# Patient Record
Sex: Female | Born: 1985 | Race: White | Hispanic: No | Smoking: Current every day smoker
Health system: Southern US, Community
[De-identification: ages and names within clinical notes are randomized; demographics above are authoritative.]

## PROBLEM LIST (undated history)

## (undated) DIAGNOSIS — N809 Endometriosis, unspecified: Secondary | ICD-10-CM

## (undated) DIAGNOSIS — K589 Irritable bowel syndrome without diarrhea: Secondary | ICD-10-CM

## (undated) DIAGNOSIS — Z8742 Personal history of other diseases of the female genital tract: Secondary | ICD-10-CM

## (undated) HISTORY — DX: Irritable bowel syndrome without diarrhea: K58.9

## (undated) HISTORY — PX: OTHER SURGICAL HISTORY: SHX169

## (undated) HISTORY — DX: Personal history of other diseases of the female genital tract: Z87.42

## (undated) HISTORY — DX: Endometriosis, unspecified: N80.9

## (undated) HISTORY — PX: TUBAL LIGATION: SHX77

---

## 2009-07-18 ENCOUNTER — Ambulatory Visit: Payer: Self-pay | Admitting: Internal Medicine

## 2009-10-28 ENCOUNTER — Ambulatory Visit: Payer: Self-pay

## 2009-10-29 ENCOUNTER — Observation Stay: Payer: Self-pay | Admitting: Obstetrics & Gynecology

## 2010-03-17 ENCOUNTER — Observation Stay: Payer: Self-pay

## 2010-06-12 ENCOUNTER — Inpatient Hospital Stay: Payer: Self-pay | Admitting: Obstetrics and Gynecology

## 2010-06-12 DIAGNOSIS — Q759 Congenital malformation of skull and face bones, unspecified: Secondary | ICD-10-CM

## 2010-06-17 LAB — PATHOLOGY REPORT

## 2011-04-19 ENCOUNTER — Ambulatory Visit: Payer: Self-pay | Admitting: Internal Medicine

## 2011-09-12 ENCOUNTER — Emergency Department: Payer: Self-pay | Admitting: Emergency Medicine

## 2011-09-12 LAB — CBC WITH DIFFERENTIAL/PLATELET
Basophil #: 0 10*3/uL (ref 0.0–0.1)
Basophil %: 0.3 %
Eosinophil #: 0.1 10*3/uL (ref 0.0–0.7)
Eosinophil %: 2.6 %
HCT: 37.5 % (ref 35.0–47.0)
HGB: 12.6 g/dL (ref 12.0–16.0)
Lymphocyte %: 33.7 %
MCH: 30.3 pg (ref 26.0–34.0)
Monocyte #: 0.4 10*3/uL (ref 0.0–0.7)
Neutrophil #: 2.6 10*3/uL (ref 1.4–6.5)
Neutrophil %: 54.9 %
Platelet: 133 10*3/uL — ABNORMAL LOW (ref 150–440)
RBC: 4.15 10*6/uL (ref 3.80–5.20)
WBC: 4.8 10*3/uL (ref 3.6–11.0)

## 2011-09-12 LAB — COMPREHENSIVE METABOLIC PANEL
Albumin: 3.8 g/dL (ref 3.4–5.0)
BUN: 18 mg/dL (ref 7–18)
Bilirubin,Total: 0.2 mg/dL (ref 0.2–1.0)
Chloride: 105 mmol/L (ref 98–107)
Co2: 29 mmol/L (ref 21–32)
EGFR (Non-African Amer.): >60
Glucose: 89 mg/dL (ref 65–99)
Potassium: 4.2 mmol/L (ref 3.5–5.1)
SGOT(AST): 25 U/L (ref 15–37)
SGPT (ALT): 20 U/L

## 2011-09-12 LAB — LIPASE, BLOOD: Lipase: 111 U/L (ref 73–393)

## 2011-09-12 LAB — URINALYSIS, COMPLETE
Blood: NEGATIVE
Glucose,UR: NEGATIVE mg/dL (ref 0–75)
Nitrite: NEGATIVE
Ph: 8 (ref 4.5–8.0)
Protein: 100
RBC,UR: 2 /HPF (ref 0–5)
Specific Gravity: 1.026 (ref 1.003–1.030)
Squamous Epithelial: 3

## 2011-09-12 LAB — PREGNANCY, URINE: Pregnancy Test, Urine: NEGATIVE m[IU]/mL

## 2011-09-12 LAB — TROPONIN I: Troponin-I: <0.02 ng/mL

## 2011-12-17 ENCOUNTER — Other Ambulatory Visit (HOSPITAL_COMMUNITY)
Admission: RE | Admit: 2011-12-17 | Discharge: 2011-12-17 | Disposition: A | Discharge status: Home / Self Care | Payer: Medicaid Other | Source: Ambulatory Visit | Attending: Obstetrics & Gynecology | Admitting: Obstetrics & Gynecology

## 2011-12-17 DIAGNOSIS — Z01419 Encounter for gynecological examination (general) (routine) without abnormal findings: Secondary | ICD-10-CM | POA: Insufficient documentation

## 2012-01-28 ENCOUNTER — Emergency Department (HOSPITAL_COMMUNITY): Payer: Medicaid Other

## 2012-01-28 ENCOUNTER — Emergency Department (HOSPITAL_COMMUNITY)
Admission: EM | Admit: 2012-01-28 | Discharge: 2012-01-28 | Disposition: A | Discharge status: Home / Self Care | Payer: Medicaid Other | Attending: Emergency Medicine | Admitting: Emergency Medicine

## 2012-01-28 ENCOUNTER — Encounter (HOSPITAL_COMMUNITY): Payer: Self-pay | Admitting: Emergency Medicine

## 2012-01-28 DIAGNOSIS — M549 Dorsalgia, unspecified: Secondary | ICD-10-CM | POA: Insufficient documentation

## 2012-01-28 DIAGNOSIS — R079 Chest pain, unspecified: Secondary | ICD-10-CM

## 2012-01-28 DIAGNOSIS — Z882 Allergy status to sulfonamides status: Secondary | ICD-10-CM | POA: Insufficient documentation

## 2012-01-28 MED ORDER — OXYCODONE-ACETAMINOPHEN 5-325 MG PO TABS: 1.0000 | ORAL_TABLET | ORAL | Status: DC | PRN

## 2012-01-28 MED ORDER — IBUPROFEN 800 MG PO TABS
800.0000 mg | ORAL_TABLET | Freq: Once | ORAL | Status: AC
  Administered 2012-01-28: 800 mg via ORAL
  Filled 2012-01-28: qty 1

## 2012-01-28 NOTE — ED Notes (Signed)
Pt c/o chest pain when she swallows something solid. Pt states she can drink liquids fine. Pt has seen her pcp for the same, she states this started yesterday.

## 2012-01-28 NOTE — ED Provider Notes (Signed)
History    26 year old female with chest pain. Onset about 2 days ago. Chest pain is in lower sternal area. Constant. Worse with certain movements such as sitting up and bending over. No radiation. Today she had increasing pain when she swallowed. No fever or chills. No vomiting. No SOB. No unsual leg pain or swelling. No prior hx of same.  CSN: [id number]  Arrival date & time 01/28/12  1626   First MD Initiated Contact with Patient 01/28/12 1733      Chief Complaint  Patient presents with  . Chest Pain  . Back Pain    (Consider location/radiation/quality/duration/timing/severity/associated sxs/prior treatment) HPI  History reviewed. No pertinent past medical history.  History reviewed. No pertinent past surgical history.  History reviewed. No pertinent family history.  History  Substance Use Topics  . Smoking status: Not on file  . Smokeless tobacco: Not on file  . Alcohol Use: No    OB History    Grav Para Term Preterm Abortions TAB SAB Ect Mult Living                  Review of Systems   Review of symptoms negative unless otherwise noted in HPI.   Allergies  Sulfa antibiotics  Home Medications   Current Outpatient Rx  Name Route Sig Dispense Refill  . B-12 PO Oral Take 1 tablet by mouth daily.    . IBUPROFEN 200 MG PO TABS Oral Take 800 mg by mouth 2 (two) times daily as needed. For pain    . MIRTAZAPINE 15 MG PO TABS Oral Take 7.5 mg by mouth at bedtime. *May increase to one tablet if tolerated*    . OMEPRAZOLE 20 MG PO CPDR Oral Take 20 mg by mouth daily.    . SUCRALFATE 1 GM/10ML PO SUSP Oral Take 1 g by mouth 4 (four) times daily.    Marland Kitchen CEFDINIR 300 MG PO CAPS Oral Take 300 mg by mouth 2 (two) times daily.      BP 107/63  Pulse 75  Temp 98.3 F (36.8 C) (Oral)  Resp 18  Ht 5\' 5"  (1.651 m)  Wt 117 lb (53.071 kg)  BMI 19.47 kg/m2  SpO2 99%  LMP 12/28/2011  Physical Exam  Nursing note and vitals reviewed. Constitutional: She appears  well-developed and well-nourished. No distress.  HENT:  Head: Normocephalic and atraumatic.  Eyes: Conjunctivae are normal. Right eye exhibits no discharge. Left eye exhibits no discharge.  Neck: Neck supple.  Cardiovascular: Normal rate, regular rhythm and normal heart sounds.  Exam reveals no gallop and no friction rub.   No murmur heard. Pulmonary/Chest: Effort normal and breath sounds normal. No respiratory distress. She exhibits no tenderness.  Abdominal: Soft. She exhibits no distension. There is no tenderness.  Musculoskeletal: She exhibits no edema and no tenderness.       Lower extremities symmetric as compared to each other. No calf tenderness. Negative Homan's. No palpable cords.   Neurological: She is alert.  Skin: Skin is warm and dry.  Psychiatric: She has a normal mood and affect. Her behavior is normal. Thought content normal.    ED Course  Procedures (including critical care time)  Labs Reviewed - No data to display Dg Chest 2 View  01/28/2012  *RADIOLOGY REPORT*  Clinical Data: Chest pain.  CHEST - 2 VIEW  Comparison: None.  Findings: Heart and mediastinal contours are within normal limits. No focal opacities or effusions.  No acute bony abnormality.  IMPRESSION: No active cardiopulmonary  disease.  Original Report Authenticated By: Cyndie Chime, M.D.   EKG:  Rhythm: normal sinus Rate: 96 Axis: normal Intervals: normal ST segments: normal   1. Chest pain       MDM  26yf with CP. Suspect musculoskeletal given reproducibility of symptoms with movement although doesn't explain acute exacerbation with swallowing today. Possible spasm or other esophageal etiology. No preceding vomiting to suggest rupture. Very atypical for ACS. Consider myo/pericarditis but symptoms exacerbated by twisting sepcifically. No rub auccultated. No viral prodrome. EKG reassuring. Consider PE but doubt. No dyspnea, not hypoxic, no signs/symptoms of DVT and no significant risk factors.  Doubt infectious. CXR clear. Afebrile. Etiology not completely clear but low suspicion for emergent etiology. Will tx symptomatically at this time. Return precautions discussed. Outp fu.        Raeford Razor, MD 02/04/12 [phone]

## 2012-01-28 NOTE — ED Notes (Signed)
Patient tearful when RN entered room.  States she is still having pain and MD told her he cannot find anything wrong with her.  Advised will check on something for pain.

## 2012-01-28 NOTE — ED Notes (Signed)
Pt states that the pain is in her mid chest and increases with any movement, she states that it hurts to move, belch, and eat solid foods.  Pt states that there is a heaviness when taking a deep breath.  Pt denies any sob.  Pt denies any strenuous activity recently.  Pt describes pain as "constant" and "sharp with movement".

## 2012-02-07 ENCOUNTER — Encounter: Payer: Self-pay | Admitting: Gastroenterology

## 2012-02-07 ENCOUNTER — Ambulatory Visit (INDEPENDENT_AMBULATORY_CARE_PROVIDER_SITE_OTHER): Payer: Medicaid Other | Admitting: Gastroenterology

## 2012-02-07 ENCOUNTER — Encounter (HOSPITAL_COMMUNITY): Payer: Self-pay | Admitting: Pharmacy Technician

## 2012-02-07 VITALS — BP 97/60 | HR 72 | Temp 97.9°F | Ht 65.0 in | Wt 116.4 lb

## 2012-02-07 DIAGNOSIS — R1310 Dysphagia, unspecified: Secondary | ICD-10-CM

## 2012-02-07 DIAGNOSIS — R079 Chest pain, unspecified: Secondary | ICD-10-CM

## 2012-02-07 MED ORDER — SODIUM CHLORIDE 0.45 % IV SOLN: INTRAVENOUS | Status: DC

## 2012-02-07 MED ORDER — OMEPRAZOLE 20 MG PO CPDR: 20.0000 mg | DELAYED_RELEASE_CAPSULE | Freq: Two times a day (BID) | ORAL | Status: DC

## 2012-02-07 NOTE — Progress Notes (Signed)
Faxed to PCP

## 2012-02-07 NOTE — Progress Notes (Signed)
Referring Provider: Jeni Salles, NP Primary Care Physician:  Jay Schlichter, OT Primary Gastroenterologist:  Dr. Darrick Penna   Chief Complaint  Patient presents with  . Pain    center of chest    HPI:   Ms. Briana Norton is a very pleasant 26 year old female who presents at the request of Ninfa Linden, NP due to chest discomfort and abdominal pain. Notes first episode of pain in March 2013, located epigastric region. Was told she may have an ulcer vs gallbladder issues. Treated X 2 weeks with PPI, not sure name. No improvement. Now notes new onset left-sided chest discomfort. Feels like somebody jabbed something through her. Worse with movement. Worse than having kids. Woke up Thursday, felt had been punched in chest. Fine during day. Rolling side to side during sleep woke her up. Eating caused excruciating pain in epigastric region.  Sunday, went to [facility] per PCP. Blood work done, pt states labs showed possible elevation to indicate ?PE; however, CT negative per pt. Sent home. GI cocktail helped. Now "bothersome in esophagus". +N/V with pain medication. No reflux. No melena. Ibuprofen a lot. Usually takes 800 mg QOD. Notes prior to these episodes was taking Ibuprofen daily.  Has lost 15 lbs since March. Going through separation. "nasty" divorce. 3 kids. States average wt was 130. Notes painful swallowing.   Past Medical History  Diagnosis Date  . Hx of menorrhagia     ? endometriosis    Past Surgical History  Procedure Date  . Tubal ligation     Current Outpatient Prescriptions  Medication Sig Dispense Refill  . Cyanocobalamin (B-12 PO) Take 1 tablet by mouth daily.      Marland Kitchen ibuprofen (ADVIL,MOTRIN) 200 MG tablet Take 800 mg by mouth 2 (two) times daily as needed. For pain      . mirtazapine (REMERON) 15 MG tablet Take 7.5 mg by mouth at bedtime. *May increase to one tablet if tolerated*      . sucralfate (CARAFATE) 1 GM/10ML suspension Take 1 g by mouth 4 (four) times daily.      Marland Kitchen  omeprazole (PRILOSEC) 20 MG capsule Take 20 mg by mouth 2 (two) times daily.       No current facility-administered medications for this visit.   Facility-Administered Medications Ordered in Other Visits  Medication Dose Route Frequency Provider Last Rate Last Dose  . 0.45 % sodium chloride infusion   Intravenous Continuous [place], MD        Allergies as of 02/07/2012 - Review Complete 02/07/2012  Allergen Reaction Noted  . Sulfa antibiotics Rash 01/28/2012    Family History  Problem Relation Age of Onset  . Colon cancer Maternal Grandfather     History   Social History  . Marital Status: Legally Separated    Spouse Name: N/A    Number of Children: N/A  . Years of Education: N/A   Occupational History  . 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 Smoking status: Current Everyday Smoker -- 0.5 packs/day    Types: Cigarettes  . Smokeless tobacco: Not on file  . Alcohol Use: No  . Drug Use: No  . Sexually Active: Not on file   Other Topics Concern  . Not on file   Social History Narrative  . No narrative on file    Review of Systems: Gen: Denies any fever, chills, loss of appetite, fatigue, weight loss. CV: Denies chest pain, heart palpitations, syncope,  peripheral edema. Resp: Denies shortness of breath with rest, cough, wheezing GI: SEE HPI GU : Denies urinary burning, urinary frequency, urinary incontinence.  MS: Denies joint pain, muscle weakness, cramps, limited movement Derm: Denies rash, itching, dry skin Psych: Denies depression, anxiety, confusion or memory loss  Heme: Denies bruising, bleeding, and enlarged lymph nodes.  Physical Exam: BP 97/60  Pulse 72  Temp 97.9 F (36.6 C) (Temporal)  Ht 5\' 5"  (1.651 m)  Wt 116 lb 6.4 oz (52.799 kg)  BMI 19.37 kg/m2  LMP 01/28/2012 General:   Alert and oriented. Well-developed, well-nourished, pleasant and cooperative. Head:  Normocephalic and  atraumatic. Eyes:  Conjunctiva pink, sclera clear, no icterus.   Conjunctiva pink. Ears:  Normal auditory acuity. Nose:  No deformity, discharge,  or lesions. Mouth:  No deformity or lesions, mucosa pink and moist.  Neck:  Supple, without mass or thyromegaly. Lungs:  Clear to auscultation bilaterally, without wheezing, rales, or rhonchi.  Heart:  S1, S2 present without murmurs noted.  Abdomen:  +BS, soft, TTP epigastric, LUQ and non-distended. Without mass or HSM. No rebound or guarding. No hernias noted. Rectal:  Deferred  Msk:  Symmetrical without gross deformities. Normal posture. Extremities:  Without clubbing or edema. Neurologic:  Alert and  oriented x4;  grossly normal neurologically. Skin:  Intact, warm and dry without significant lesions or rashes Cervical Nodes:  No significant cervical adenopathy. Psych:  Alert and cooperative. Normal mood and affect.

## 2012-02-07 NOTE — Patient Instructions (Addendum)
We have set you up for an upper endoscopy with Dr. Darrick Penna.   Start taking Prilosec twice a day, 30 minutes before breakfast and dinner. Samples have been provided, and I have sent a prescription to your pharmacy.   Avoid Ibuprofen.   Further recommendations once the procedure is complete.

## 2012-02-07 NOTE — Assessment & Plan Note (Signed)
Left-sided chest discomfort, epigastric pain with eating in the setting of daily to every other day Ibuprofen use. Notes odynophagia as well. Prior episode of epigastric pain in March 2013. Recent abx for sinus infection. Query esophagitis, gastritis, PUD. (PE ruled out at [facility] recently). Highly doubt cardiac/pulmonary etiology. Increase PPI to BID, proceed with EGD, possible dilation in near future.   Proceed with upper endoscopy in the near future with Dr. Darrick Penna. The risks, benefits, and alternatives have been discussed in detail with patient. They have stated understanding and desire to proceed.

## 2012-02-07 NOTE — Assessment & Plan Note (Signed)
?  candida esophagitis? Pill-induced?

## 2012-02-08 ENCOUNTER — Ambulatory Visit (HOSPITAL_COMMUNITY)
Admission: RE | Admit: 2012-02-08 | Discharge: 2012-02-08 | Disposition: A | Discharge status: Home / Self Care | Payer: Medicaid Other | Source: Ambulatory Visit | Attending: Gastroenterology | Admitting: Gastroenterology

## 2012-02-08 ENCOUNTER — Encounter (HOSPITAL_COMMUNITY): Payer: Self-pay

## 2012-02-08 ENCOUNTER — Encounter (HOSPITAL_COMMUNITY)
Admission: RE | Disposition: A | Discharge status: Home / Self Care | Payer: Self-pay | Source: Ambulatory Visit | Attending: Gastroenterology

## 2012-02-08 DIAGNOSIS — R1013 Epigastric pain: Secondary | ICD-10-CM

## 2012-02-08 DIAGNOSIS — K2940 Chronic atrophic gastritis without bleeding: Secondary | ICD-10-CM | POA: Insufficient documentation

## 2012-02-08 DIAGNOSIS — K2970 Gastritis, unspecified, without bleeding: Secondary | ICD-10-CM

## 2012-02-08 DIAGNOSIS — R634 Abnormal weight loss: Secondary | ICD-10-CM

## 2012-02-08 DIAGNOSIS — K2990 Gastroduodenitis, unspecified, without bleeding: Secondary | ICD-10-CM

## 2012-02-08 DIAGNOSIS — R109 Unspecified abdominal pain: Secondary | ICD-10-CM | POA: Insufficient documentation

## 2012-02-08 DIAGNOSIS — R079 Chest pain, unspecified: Secondary | ICD-10-CM

## 2012-02-08 HISTORY — PX: ESOPHAGOGASTRODUODENOSCOPY: SHX1529

## 2012-02-08 SURGERY — ESOPHAGOGASTRODUODENOSCOPY (EGD) WITH ESOPHAGEAL DILATION
Anesthesia: Moderate Sedation

## 2012-02-08 MED ORDER — BUTAMBEN-TETRACAINE-BENZOCAINE 2-2-14 % EX AERO
INHALATION_SPRAY | CUTANEOUS | Status: DC | PRN
  Administered 2012-02-08: 1 via TOPICAL

## 2012-02-08 MED ORDER — STERILE WATER FOR IRRIGATION IR SOLN
Status: DC | PRN
  Administered 2012-02-08: 14:00:00

## 2012-02-08 MED ORDER — MIDAZOLAM HCL 5 MG/5ML IJ SOLN
INTRAMUSCULAR | Status: AC
  Filled 2012-02-08: qty 10

## 2012-02-08 MED ORDER — MEPERIDINE HCL 100 MG/ML IJ SOLN
INTRAMUSCULAR | Status: AC
  Filled 2012-02-08: qty 2

## 2012-02-08 MED ORDER — PROMETHAZINE HCL 25 MG/ML IJ SOLN
INTRAMUSCULAR | Status: AC
  Filled 2012-02-08: qty 1

## 2012-02-08 MED ORDER — MINERAL OIL PO OIL
TOPICAL_OIL | ORAL | Status: AC
  Filled 2012-02-08: qty 30

## 2012-02-08 MED ORDER — MEPERIDINE HCL 100 MG/ML IJ SOLN
INTRAMUSCULAR | Status: DC | PRN
  Administered 2012-02-08: 50 mg via INTRAVENOUS
  Administered 2012-02-08: 25 mg via INTRAVENOUS

## 2012-02-08 MED ORDER — MIDAZOLAM HCL 5 MG/5ML IJ SOLN
INTRAMUSCULAR | Status: DC | PRN
  Administered 2012-02-08 (×2): 2 mg via INTRAVENOUS

## 2012-02-08 MED ORDER — PROMETHAZINE HCL 25 MG/ML IJ SOLN
INTRAMUSCULAR | Status: DC | PRN
  Administered 2012-02-08: 12.5 mg via INTRAVENOUS

## 2012-02-08 NOTE — Op Note (Signed)
[facility] [address] [place], [address]   ENDOSCOPY PROCEDURE REPORT  PATIENT: Briana Norton, Briana Norton  MR#: [id number] BIRTHDATE: 1986-02-12 , 26  yrs. old GENDER: Female  ENDOSCOPIST: Jonette Eva, MD REFFERED VF:IEPPI Patterson, NP  PROCEDURE DATE:  02/08/2012  PROCEDURE:   EGD with biopsy and EGD with dilatation over guidewire   INDICATIONS:1.  abdominal pain.   2.  weight loss: 30-40 LBS.  NO CHANGE IN BOWEL HABITS. CT OF CHEST AT [facility] SHOWED NO ACUTE PROCESS  MEDICATIONS: Demerol 75, Versed 4, and Promethazine (Phenergan) 12.5  TOPICAL ANESTHETIC: Cetacaine Spray  DESCRIPTION OF PROCEDURE:   After the risks benefits and alternatives of the procedure were thoroughly explained, informed consent was obtained.  The EG-[id number] ([id number])  endoscope was introduced through the mouth and advanced to the second portion of the duodenum.  The instrument was slowly withdrawn as the mucosa was carefully examined.  Prior to withdrawal of the scope, the guidwire was placed.  The esophagus was dilated successfully.  The patient was recovered in endoscopy and discharged home in satisfactory condition.      ESOPHAGUS: The mucosa of the esophagus appeared normal.  Multiple biopsies were performed 20 M AND 33 CM FROM THE TEETH. GE JXN 38 CM FROM THE TEETH.   NO BARRETT'S.  STOMACH: Mild non-erosive gastritis (inflammation) was found in the gastric antrum.  BIOPSIED VIA COLD FORCEPS.  DUODENUM: The duodenal mucosa showed no abnormalities.  Cold forcep biopsies were taken in the second portion.   Dilation was then performed at the gastroesphageal junction  Dilator: Savary over guidewire Size(s): 16 MM Resistance: moderate Heme: none  COMPLICATIONS: There were no complications.   ENDOSCOPIC IMPRESSION: 1.   The mucosa of the esophagus appeared normal; multiple biopsies 2.   Non-erosive gastritis (inflammation) was found in the gastric antrum 3.   The duodenal mucosa  showed no abnormalities 4.  NO OBVIOUS SOURCE FOR CHEST/EPIGASTRIC PAIN & WEIGHT LOSS IDENTIFIED.  RECOMMENDATIONS: PPI bid BOOST, ENSURE, OR CIB QID AWAIT BIOPSY OPV IN 1 MO. IF SX PERSISTS, CONSIDER BAS[facility] AND/OR REFERRAL TO [facility]       _______________________________ eSignedJonette Eva, MD 02/08/2012 2:49 PM      PATIENT NAME:  Briana Norton, Briana Norton MR#: [id number]

## 2012-02-08 NOTE — H&P (Signed)
  Primary Care Physician:  Jay Schlichter, OT Primary Gastroenterologist:  Dr. Darrick Penna  Pre-Procedure History & Physical: HPI:  Briana Norton is a 26 y.o. female here for ABDOMINAL PAIN/WEIGHT LOSS.  Past Medical History  Diagnosis Date  . Hx of menorrhagia     ? endometriosis    Past Surgical History  Procedure Date  . Tubal ligation     Prior to Admission medications   Medication Sig Start Date End Date Taking? Authorizing Provider  Cyanocobalamin (B-12 PO) Take 1 tablet by mouth daily.   Yes Historical Provider, MD  ibuprofen (ADVIL,MOTRIN) 200 MG tablet Take 800 mg by mouth 2 (two) times daily as needed. For pain   Yes Historical Provider, MD  mirtazapine (REMERON) 15 MG tablet Take 7.5 mg by mouth at bedtime. *May increase to one tablet if tolerated*   Yes Historical Provider, MD  omeprazole (PRILOSEC) 20 MG capsule Take 20 mg by mouth 2 (two) times daily.   Yes Historical Provider, MD  sucralfate (CARAFATE) 1 GM/10ML suspension Take 1 g by mouth 4 (four) times daily. 01/28/12  Yes Historical Provider, MD    Allergies as of 02/07/2012 - Review Complete 02/07/2012  Allergen Reaction Noted  . Sulfa antibiotics Rash 01/28/2012    Family History  Problem Relation Age of Onset  . Colon cancer Maternal Grandfather     History   Social History  . Marital Status: Legally Separated    Spouse Name: N/A    Number of Children: N/A  . Years of Education: N/A   Occupational History  . 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 Smoking status: Current Everyday Smoker -- 0.5 packs/day    Types: Cigarettes  . Smokeless tobacco: Not on file  . Alcohol Use: No  . Drug Use: No  . Sexually Active: Not on file   Other Topics Concern  . Not on file   Social History Narrative  . No narrative on file    Review of Systems: See HPI, otherwise negative ROS   Physical Exam: LMP 01/28/2012 General:   Alert,  pleasant and  cooperative in NAD Head:  Normocephalic and atraumatic. Neck:  Supple;  Lungs:  Clear throughout to auscultation.    Heart:  Regular rate and rhythm. Abdomen:  Soft, nontender and nondistended. Normal bowel sounds, without guarding, and without rebound.   Neurologic:  Alert and  oriented x4;  grossly normal neurologically.  Impression/Plan:    ABDOMINAL PAIN/WEIGHT LOSS.  Plan: 1. EGD TODAY

## 2012-02-08 NOTE — Progress Notes (Signed)
REVIEWED.  

## 2012-02-17 ENCOUNTER — Telehealth: Payer: Self-pay | Admitting: Gastroenterology

## 2012-02-17 NOTE — Telephone Encounter (Signed)
appt set for Sept 30 w/ AS

## 2012-02-17 NOTE — Progress Notes (Signed)
Received ED notes from [facility]. D-dimer 668. CTA ordered, do not have actual report but do have note from resident stating "CTA showed no PE or aortic dissection, pt felt safe for d/c home with viscous lidocaine, norco, and Toradol for pain control and GI referral".

## 2012-02-17 NOTE — Telephone Encounter (Signed)
Please call pt. HER stomach Bx shows gastritis. THE BIOPSIES OF HER ESOPHAGUS AND SMALL BOWEL ARE NORMAL.   CONTINUE OMEPRAZOLE 30 MINUTES PRIOR TO MEALS TWICE DAILY.  CONTINUE REMERON.  BOOST, ENSURE, OR CARNATION INSTANT BREAKFAST FOUR TIMES A DAY.  FOLLOW UP IN LATE SEP/EARLY OCT WITH AS E 30-ABD PAIN.

## 2012-02-17 NOTE — Telephone Encounter (Signed)
Pt aware of results. Darl Pikes can you NIC for Sept or Oct. (E-30)

## 2012-03-10 ENCOUNTER — Encounter: Payer: Self-pay | Admitting: Gastroenterology

## 2012-03-13 ENCOUNTER — Ambulatory Visit (INDEPENDENT_AMBULATORY_CARE_PROVIDER_SITE_OTHER): Payer: Medicaid Other | Admitting: Gastroenterology

## 2012-03-13 ENCOUNTER — Encounter: Payer: Self-pay | Admitting: Gastroenterology

## 2012-03-13 VITALS — BP 104/66 | HR 66 | Temp 98.2°F | Ht 65.0 in | Wt 118.0 lb

## 2012-03-13 DIAGNOSIS — R1310 Dysphagia, unspecified: Secondary | ICD-10-CM

## 2012-03-13 DIAGNOSIS — R079 Chest pain, unspecified: Secondary | ICD-10-CM

## 2012-03-13 NOTE — Patient Instructions (Addendum)
Continue taking Prilosec twice a day.   Dr. Lodema Hong and Dr. Jeanice Lim are both primary care providers in the same office. Their number is [phone].]  Return in 6 months; however, if you are doing well, you can return in 1 year.

## 2012-03-13 NOTE — Progress Notes (Signed)
Faxed to PCP

## 2012-03-13 NOTE — Progress Notes (Signed)
Referring Provider: Jeni Salles, NP Primary Care Physician:  Ninfa Linden, NP Primary Gastroenterologist: Dr. Darrick Penna   Chief Complaint  Patient presents with  . Follow-up    HPI:   Recently underwent EGD/ED with Dr. Darrick Penna secondary to chest pain, odynophagia. Findings of non-erosive gastritis, negative H.pylori, s/p Savary dilation. PPI BID. Now up 2 lbs. Recommendations to complete barium swallow and/or refer to [facility] if continued symptoms.  Pt returns today with significant improvement since dilation. Intermittent reflux but as long as takes Prilosec BID she does well. Not doing Boost/Ensure in a few weeks. Appetite has come back. Child support taken care of. Jan 4th will be one year separated, can file for divorce.   Past Medical History  Diagnosis Date  . Hx of menorrhagia     ? endometriosis    Past Surgical History  Procedure Date  . Tubal ligation   . Esophagogastroduodenoscopy 02/08/2012    Non-erosive gastritis (inflammation) was found in the gastric antrum/ The mucosa of the esophagus appeared normal; multiple biopsies, path: negative h.pylori    Current Outpatient Prescriptions  Medication Sig Dispense Refill  . Cyanocobalamin (B-12 PO) Take 1 tablet by mouth daily.      Marland Kitchen ibuprofen (ADVIL,MOTRIN) 200 MG tablet Take 800 mg by mouth 2 (two) times daily as needed. For pain      . mirtazapine (REMERON) 15 MG tablet Take 7.5 mg by mouth at bedtime. *May increase to one tablet if tolerated*      . omeprazole (PRILOSEC) 20 MG capsule Take 20 mg by mouth 2 (two) times daily.        Allergies as of 03/13/2012 - Review Complete 03/13/2012  Allergen Reaction Noted  . Sulfa antibiotics Rash 01/28/2012    Family History  Problem Relation Age of Onset  . Colon cancer Maternal Grandfather     History   Social History  . Marital Status: Legally Separated    Spouse Name: N/A    Number of Children: N/A  . Years of Education: N/A   Occupational History  .  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 Smoking status: Current Every Day Smoker -- 0.5 packs/day    Types: Cigarettes  . Smokeless tobacco: None  . Alcohol Use: No  . Drug Use: No  . Sexually Active: None   Other Topics Concern  . None   Social History Narrative  . None    Review of Systems: Gen: Denies fever, chills, anorexia. Denies fatigue, weakness, weight loss.  CV: Denies chest pain, palpitations, syncope, peripheral edema, and claudication. Resp: Denies dyspnea at rest, cough, wheezing, coughing up blood, and pleurisy. GI: Denies vomiting blood, jaundice, and fecal incontinence.   Denies dysphagia or odynophagia. Derm: Denies rash, itching, dry skin Psych: Denies depression, anxiety, memory loss, confusion. No homicidal or suicidal ideation.  Heme: Denies bruising, bleeding, and enlarged lymph nodes.  Physical Exam: BP 104/66  Pulse 66  Temp 98.2 F (36.8 C) (Temporal)  Ht 5\' 5"  (1.651 m)  Wt 118 lb (53.524 kg)  BMI 19.64 kg/m2  LMP 03/06/2012 General:   Alert and oriented. No distress noted. Pleasant and cooperative.  Head:  Normocephalic and atraumatic. Eyes:  Conjuctiva clear without scleral icterus. Heart:  S1, S2 present without murmurs, rubs, or gallops. Regular rate and rhythm. Abdomen:  +BS, soft, non-tender and non-distended. No rebound or guarding. No HSM or masses noted. Msk:  Symmetrical without gross deformities. Normal  posture. Extremities:  Without edema. Neurologic:  Alert and  oriented x4;  grossly normal neurologically. Skin:  Intact without significant lesions or rashes. Psych:  Alert and cooperative. Normal mood and affect.

## 2012-03-13 NOTE — Assessment & Plan Note (Signed)
Resolved. Denies dysphagia, odynophagia. On Prilosec BID. No etiology to symptoms found; however, notes significant improvement since dilation. Negative H.pylori.   If symptoms recur, proceed with barium swallow and consider [facility] referral. Continue PPI BID Return in 6 mos. Informed pt if she continued to do well, may postpone for 1 year.  Offered PCP #'s to establish care. Ninfa Linden not practicing currently per pt.

## 2012-04-22 NOTE — Progress Notes (Signed)
REVIEWED.  

## 2012-08-21 ENCOUNTER — Encounter: Payer: Self-pay | Admitting: *Deleted

## 2012-11-15 ENCOUNTER — Emergency Department (HOSPITAL_COMMUNITY): Payer: Medicaid Other

## 2012-11-15 ENCOUNTER — Encounter (HOSPITAL_COMMUNITY): Payer: Self-pay | Admitting: Emergency Medicine

## 2012-11-15 ENCOUNTER — Emergency Department (HOSPITAL_COMMUNITY)
Admission: EM | Admit: 2012-11-15 | Discharge: 2012-11-16 | Disposition: A | Discharge status: Home / Self Care | Payer: Medicaid Other | Attending: Emergency Medicine | Admitting: Emergency Medicine

## 2012-11-15 DIAGNOSIS — Z9851 Tubal ligation status: Secondary | ICD-10-CM | POA: Insufficient documentation

## 2012-11-15 DIAGNOSIS — Z79899 Other long term (current) drug therapy: Secondary | ICD-10-CM | POA: Insufficient documentation

## 2012-11-15 DIAGNOSIS — R109 Unspecified abdominal pain: Secondary | ICD-10-CM | POA: Insufficient documentation

## 2012-11-15 DIAGNOSIS — Z3202 Encounter for pregnancy test, result negative: Secondary | ICD-10-CM | POA: Insufficient documentation

## 2012-11-15 DIAGNOSIS — Z87798 Personal history of other (corrected) congenital malformations: Secondary | ICD-10-CM | POA: Insufficient documentation

## 2012-11-15 DIAGNOSIS — F17200 Nicotine dependence, unspecified, uncomplicated: Secondary | ICD-10-CM | POA: Insufficient documentation

## 2012-11-15 LAB — CBC WITH DIFFERENTIAL/PLATELET
Basophils Absolute: 0 10*3/uL (ref 0.0–0.1)
HCT: 41.5 % (ref 36.0–46.0)
Hemoglobin: 13.6 g/dL (ref 12.0–15.0)
Lymphocytes Relative: 49 % — ABNORMAL HIGH (ref 12–46)
Lymphs Abs: 3 10*3/uL (ref 0.7–4.0)
Monocytes Absolute: 0.3 10*3/uL (ref 0.1–1.0)
Monocytes Relative: 5 % (ref 3–12)
Neutro Abs: 2.6 10*3/uL (ref 1.7–7.7)
RBC: 4.76 MIL/uL (ref 3.87–5.11)
RDW: 13.8 % (ref 11.5–15.5)
WBC: 6.1 10*3/uL (ref 4.0–10.5)

## 2012-11-15 LAB — COMPREHENSIVE METABOLIC PANEL
BUN: 16 mg/dL (ref 6–23)
Calcium: 9.5 mg/dL (ref 8.4–10.5)
Creatinine, Ser: 0.65 mg/dL (ref 0.50–1.10)
GFR calc Af Amer: >90 mL/min (ref >90)
Glucose, Bld: 85 mg/dL (ref 70–99)
Total Protein: 7.7 g/dL (ref 6.0–8.3)

## 2012-11-15 LAB — URINALYSIS, ROUTINE W REFLEX MICROSCOPIC
Bilirubin Urine: NEGATIVE
Ketones, ur: NEGATIVE mg/dL
Leukocytes, UA: NEGATIVE
Nitrite: NEGATIVE
Specific Gravity, Urine: 1.015 (ref 1.005–1.030)
Urobilinogen, UA: 0.2 mg/dL (ref 0.0–1.0)

## 2012-11-15 LAB — WET PREP, GENITAL
Clue Cells Wet Prep HPF POC: NONE SEEN
Trich, Wet Prep: NONE SEEN

## 2012-11-15 MED ORDER — OXYCODONE-ACETAMINOPHEN 5-325 MG PO TABS: 1.0000 | ORAL_TABLET | ORAL | Status: DC | PRN

## 2012-11-15 MED ORDER — SODIUM CHLORIDE 0.9 % IV BOLUS (SEPSIS)
1000.0000 mL | Freq: Once | INTRAVENOUS | Status: AC
  Administered 2012-11-15: 1000 mL via INTRAVENOUS

## 2012-11-15 MED ORDER — PROMETHAZINE HCL 25 MG PO TABS: 25.0000 mg | ORAL_TABLET | Freq: Four times a day (QID) | ORAL | Status: DC | PRN

## 2012-11-15 MED ORDER — IOHEXOL 300 MG/ML  SOLN
50.0000 mL | Freq: Once | INTRAMUSCULAR | Status: AC | PRN
  Administered 2012-11-15: 50 mL via ORAL

## 2012-11-15 MED ORDER — ONDANSETRON HCL 4 MG/2ML IJ SOLN
4.0000 mg | Freq: Once | INTRAMUSCULAR | Status: AC
  Administered 2012-11-15: 4 mg via INTRAVENOUS
  Filled 2012-11-15: qty 2

## 2012-11-15 MED ORDER — HYDROMORPHONE HCL PF 1 MG/ML IJ SOLN
0.5000 mg | Freq: Once | INTRAMUSCULAR | Status: AC
  Administered 2012-11-15: 0.5 mg via INTRAVENOUS
  Filled 2012-11-15: qty 1

## 2012-11-15 MED ORDER — IOHEXOL 300 MG/ML  SOLN
100.0000 mL | Freq: Once | INTRAMUSCULAR | Status: AC | PRN
  Administered 2012-11-15: 100 mL via INTRAVENOUS

## 2012-11-15 NOTE — ED Provider Notes (Signed)
History     This chart was scribed for Donnetta Hutching, MD, MD by Smitty Pluck, ED Scribe. The patient was seen in room APA05/APA05 and the patient's care was started at 6:26 PM.   CSN: [id number]  Arrival date & time 11/15/12  1748   Chief Complaint  Patient presents with  . Abdominal Pain    The history is provided by the patient and medical records. No language interpreter was used.   HPI Comments: Briana Norton is a 27 y.o. female who presents to the Emergency Department complaining of constant, moderate, stabbing lower abdominal pain onset 3 days and swelling onset 1 week ago. She reports that she has tubal ligation and IUD. She mentions having hx of abdometriosis. Pain is aggravated by movement. LMP was 1 week ago and she states that her LMP was lighter than usual. Pt denies dysuria, vaginal discharge, vaginal bleeding, fever, chills, nausea, vomiting, diarrhea, weakness, cough, SOB and any other pain. She states she had CT scan at [facility] 1 month ago showing cyst.   Pelvic Pain clinic at [facility]     Past Medical History  Diagnosis Date  . Hx of menorrhagia     ? endometriosis    Past Surgical History  Procedure Laterality Date  . Tubal ligation    . Esophagogastroduodenoscopy  02/08/2012    Non-erosive gastritis (inflammation) was found in the gastric antrum/ The mucosa of the esophagus appeared normal; multiple biopsies, path: negative h.pylori    Family History  Problem Relation Age of Onset  . Colon cancer Maternal Grandfather     History  Substance Use Topics  . Smoking status: Current Every Day Smoker -- 0.50 packs/day    Types: Cigarettes  . Smokeless tobacco: Not on file  . Alcohol Use: No    OB History   Grav Para Term Preterm Abortions TAB SAB Ect Mult Living                  Review of Systems 10 Systems reviewed and all are negative for acute change except as noted in the HPI.   Allergies  Sulfa antibiotics  Home Medications   Current  Outpatient Rx  Name  Route  Sig  Dispense  Refill  . Cyanocobalamin (B-12 PO)   Oral   Take 1 tablet by mouth daily.         Marland Kitchen ibuprofen (ADVIL,MOTRIN) 200 MG tablet   Oral   Take 800 mg by mouth 2 (two) times daily as needed. For pain         . mirtazapine (REMERON) 15 MG tablet   Oral   Take 7.5 mg by mouth at bedtime. *May increase to one tablet if tolerated*         . omeprazole (PRILOSEC) 20 MG capsule   Oral   Take 20 mg by mouth 2 (two) times daily.           BP 110/64  Pulse 68  Temp(Src) 98.6 F (37 C)  Resp 18  Ht 5\' 5"  (1.651 m)  Wt 125 lb (56.7 kg)  BMI 20.8 kg/m2  SpO2 100%  LMP 11/13/2012  Physical Exam  Nursing note and vitals reviewed. Constitutional: She is oriented to person, place, and time. She appears well-developed and well-nourished.  HENT:  Head: Normocephalic and atraumatic.  Eyes: Conjunctivae and EOM are normal. Pupils are equal, round, and reactive to light.  Neck: Normal range of motion. Neck supple.  Cardiovascular: Normal rate, regular rhythm and  normal heart sounds.   Pulmonary/Chest: Effort normal and breath sounds normal.  Abdominal: Soft. Bowel sounds are normal. She exhibits distension (mild).  Genitourinary:  Nl  External genitalia  Nl appear cervix Slight amount of blood from cervical os. No cervical tenderness or adnexal tenderness   Musculoskeletal: Normal range of motion.  Neurological: She is alert and oriented to person, place, and time.  Skin: Skin is warm and dry.  Psychiatric: She has a normal mood and affect.    ED Course  Procedures (including critical care time) DIAGNOSTIC STUDIES: Oxygen Saturation is 100% on room air, normal by my interpretation.    COORDINATION OF CARE: 6:30 PM Discussed ED treatment with pt and pt agrees to pelvic exam, labs, UA and pain medication.  Medications  sodium chloride 0.9 % bolus 1,000 mL (1,000 mLs Intravenous New Bag/Given 11/15/12 1950)  HYDROmorphone (DILAUDID)  injection 0.5 mg (0.5 mg Intravenous Given 11/15/12 1949)  ondansetron (ZOFRAN) injection 4 mg (4 mg Intravenous Given 11/15/12 1949)    Results for orders placed during the hospital encounter of 11/15/12  WET PREP, GENITAL      Result Value Range   Yeast Wet Prep HPF POC NONE SEEN  NONE SEEN   Trich, Wet Prep NONE SEEN  NONE SEEN   Clue Cells Wet Prep HPF POC NONE SEEN  NONE SEEN   WBC, Wet Prep HPF POC FEW (*) NONE SEEN  URINALYSIS, ROUTINE W REFLEX MICROSCOPIC      Result Value Range   Color, Urine YELLOW  YELLOW   APPearance HAZY (*) CLEAR   Specific Gravity, Urine 1.015  1.005 - 1.030   pH 6.0  5.0 - 8.0   Glucose, UA NEGATIVE  NEGATIVE mg/dL   Hgb urine dipstick NEGATIVE  NEGATIVE   Bilirubin Urine NEGATIVE  NEGATIVE   Ketones, ur NEGATIVE  NEGATIVE mg/dL   Protein, ur NEGATIVE  NEGATIVE mg/dL   Urobilinogen, UA 0.2  0.0 - 1.0 mg/dL   Nitrite NEGATIVE  NEGATIVE   Leukocytes, UA NEGATIVE  NEGATIVE  PREGNANCY, URINE      Result Value Range   Preg Test, Ur NEGATIVE  NEGATIVE  COMPREHENSIVE METABOLIC PANEL      Result Value Range   Sodium 139  135 - 145 mEq/L   Potassium 3.7  3.5 - 5.1 mEq/L   Chloride 102  96 - 112 mEq/L   CO2 27  19 - 32 mEq/L   Glucose, Bld 85  70 - 99 mg/dL   BUN 16  6 - 23 mg/dL   Creatinine, Ser 1.61  0.50 - 1.10 mg/dL   Calcium 9.5  8.4 - 09.6 mg/dL   Total Protein 7.7  6.0 - 8.3 g/dL   Albumin 4.2  3.5 - 5.2 g/dL   AST 18  0 - 37 U/L   ALT 12  0 - 35 U/L   Alkaline Phosphatase 103  39 - 117 U/L   Total Bilirubin 0.3  0.3 - 1.2 mg/dL   GFR calc non Af Amer >90  >90 mL/min   GFR calc Af Amer >90  >90 mL/min  CBC WITH DIFFERENTIAL      Result Value Range   WBC 6.1  4.0 - 10.5 K/uL   RBC 4.76  3.87 - 5.11 MIL/uL   Hemoglobin 13.6  12.0 - 15.0 g/dL   HCT 04.5  40.9 - 81.1 %   MCV 87.2  78.0 - 100.0 fL   MCH 28.6  26.0 - 34.0 pg  MCHC 32.8  30.0 - 36.0 g/dL   RDW 40.9  81.1 - 91.4 %   Platelets 195  150 - 400 K/uL   Neutrophils Relative % 43   43 - 77 %   Neutro Abs 2.6  1.7 - 7.7 K/uL   Lymphocytes Relative 49 (*) 12 - 46 %   Lymphs Abs 3.0  0.7 - 4.0 K/uL   Monocytes Relative 5  3 - 12 %   Monocytes Absolute 0.3  0.1 - 1.0 K/uL   Eosinophils Relative 3  0 - 5 %   Eosinophils Absolute 0.2  0.0 - 0.7 K/uL   Basophils Relative 0  0 - 1 %   Basophils Absolute 0.0  0.0 - 0.1 K/uL  LIPASE, BLOOD      Result Value Range   Lipase 22  11 - 59 U/L    Ct Abdomen Pelvis W Contrast  11/15/2012   *RADIOLOGY REPORT*  Clinical Data: Lower abdominal pain and bloating.  CT ABDOMEN AND PELVIS WITH CONTRAST  Technique:  Multidetector CT imaging of the abdomen and pelvis was performed following the standard protocol during bolus administration of intravenous contrast.  Contrast: 100 mL of Omnipaque 300 IV contrast  Comparison: None.  Findings: Minimal bibasilar atelectasis is noted.  A vague 0.6 cm hypodensity within the right hepatic lobe is nonspecific but may reflect a small cyst.  The liver is otherwise unremarkable in appearance.  The spleen is within normal limits. Apparent mild periportal edema is thought to reflect volume repletion.  The gallbladder is within normal limits.  The pancreas and adrenal glands are unremarkable.  Vaguely decreased attenuation at the proximal pancreatic body is thought to reflect beam hardening artifact.  The kidneys are unremarkable in appearance.  There is no evidence of hydronephrosis.  No renal or ureteral stones are seen.  No perinephric stranding is appreciated.  No free fluid is identified.  The small bowel is unremarkable in appearance.  The stomach is within normal limits.  No acute vascular abnormalities are seen.  The appendix remains grossly normal in caliber on coronal images, though difficult to fully characterize due to adjacent structures. There is no definite evidence for appendicitis.  Contrast progresses to the level of the ascending colon.  The colon is partially filled with stool and is unremarkable in  appearance.  The bladder is moderately distended and grossly unremarkable in appearance.  There is mildly heterogeneous enhancement of the uterus, which may reflect the patient's history of endometriosis. The patient's intrauterine device is noted in expected position, at the fundus of the uterus.  The ovaries are slightly prominent but relatively symmetric; no suspicious adnexal masses are seen.  No inguinal lymphadenopathy is seen.  No acute osseous abnormalities are identified.  IMPRESSION:  1.  No definite acute abnormality seen within the abdomen or pelvis to explain the patient's symptoms. 2.  Mildly heterogeneous uterine enhancement may reflect the patient's history of endometriosis. 3.  Intrauterine device noted in expected position, at the fundus of the uterus.  Ovaries grossly symmetric; no significant ovarian cysts seen. 4.  Question of small hepatic cyst.   Original Report Authenticated By: Tonia Ghent, M.D.      No results found.   No diagnosis found.    MDM  No acute abdomen. Pelvic exam shows no acute pathology. CT scan as above.  Discharge meds Percocet #20 and Phenergan 25 mg #15.  Patient has primary care followup     I personally performed the services described  in this documentation, which was scribed in my presence. The recorded information has been reviewed and is accurate.   Donnetta Hutching, MD 11/15/12 [phone]

## 2012-11-15 NOTE — ED Notes (Signed)
Pt c/o lower abd swelling x 1 week and lower abd pain x 3-4 days. Denies vaginal d/c, states she just finished her menstrual cycle.

## 2012-11-17 LAB — GC/CHLAMYDIA PROBE AMP: GC Probe RNA: NEGATIVE

## 2012-12-13 ENCOUNTER — Encounter: Payer: Self-pay | Admitting: Gastroenterology

## 2012-12-14 ENCOUNTER — Encounter: Payer: Self-pay | Admitting: Gastroenterology

## 2012-12-14 ENCOUNTER — Ambulatory Visit (INDEPENDENT_AMBULATORY_CARE_PROVIDER_SITE_OTHER): Payer: Medicaid Other | Admitting: Gastroenterology

## 2012-12-14 VITALS — BP 97/60 | HR 76 | Temp 97.4°F | Ht 65.0 in | Wt 129.8 lb

## 2012-12-14 DIAGNOSIS — R109 Unspecified abdominal pain: Secondary | ICD-10-CM

## 2012-12-14 DIAGNOSIS — R079 Chest pain, unspecified: Secondary | ICD-10-CM

## 2012-12-14 DIAGNOSIS — R1310 Dysphagia, unspecified: Secondary | ICD-10-CM

## 2012-12-14 MED ORDER — LUBIPROSTONE 24 MCG PO CAPS: 24.0000 ug | ORAL_CAPSULE | Freq: Two times a day (BID) | ORAL | Status: DC

## 2012-12-14 NOTE — Patient Instructions (Addendum)
Start taking Amitiza 1 capsule WITH FOOD each evening. Watch for nausea or diarrhea. If you do well with taking it once daily, increase to twice daily. IF this does not do well, please call, and we will switch to another medication. Call us in 7-10 days with a report.  Start taking a probiotic daily. Some examples are Align, Restora, Digestive Advantage, Phillip's Colon Health. Review the high fiber diet.  We will see you back in 6 weeks! Please call if any issues.   High-Fiber Diet Fiber is found in fruits, vegetables, and grains. A high-fiber diet encourages the addition of more whole grains, legumes, fruits, and vegetables in your diet. The recommended amount of fiber for adult males is 38 g per day. For adult females, it is 25 g per day. Pregnant and lactating women should get 28 g of fiber per day. If you have a digestive or bowel problem, ask your caregiver for advice before adding high-fiber foods to your diet. Eat a variety of high-fiber foods instead of only a select few type of foods.  PURPOSE  To increase stool bulk.  To make bowel movements more regular to prevent constipation.  To lower cholesterol.  To prevent overeating. WHEN IS THIS DIET USED?  It may be used if you have constipation and hemorrhoids.  It may be used if you have uncomplicated diverticulosis (intestine condition) and irritable bowel syndrome.  It may be used if you need help with weight management.  It may be used if you want to add it to your diet as a protective measure against atherosclerosis, diabetes, and cancer. SOURCES OF FIBER  Whole-grain breads and cereals.  Fruits, such as apples, oranges, bananas, berries, prunes, and pears.  Vegetables, such as green peas, carrots, sweet potatoes, beets, broccoli, cabbage, spinach, and artichokes.  Legumes, such split peas, soy, lentils.  Almonds. FIBER CONTENT IN FOODS Starches and Grains / Dietary Fiber (g)  Cheerios, 1 cup / 3 g  Corn Flakes  cereal, 1 cup / 0.7 g  Rice crispy treat cereal, 1 cup / 0.3 g  Instant oatmeal (cooked),  cup / 2 g  Frosted wheat cereal, 1 cup / 5.1 g  Brown, long-grain rice (cooked), 1 cup / 3.5 g  White, long-grain rice (cooked), 1 cup / 0.6 g  Enriched macaroni (cooked), 1 cup / 2.5 g Legumes / Dietary Fiber (g)  Baked beans (canned, plain, or vegetarian),  cup / 5.2 g  Kidney beans (canned),  cup / 6.8 g  Pinto beans (cooked),  cup / 5.5 g Breads and Crackers / Dietary Fiber (g)  Plain or honey graham crackers, 2 squares / 0.7 g  Saltine crackers, 3 squares / 0.3 g  Plain, salted pretzels, 10 pieces / 1.8 g  Whole-wheat bread, 1 slice / 1.9 g  White bread, 1 slice / 0.7 g  Raisin bread, 1 slice / 1.2 g  Plain bagel, 3 oz / 2 g  Flour tortilla, 1 oz / 0.9 g  Corn tortilla, 1 small / 1.5 g  Hamburger or hotdog bun, 1 small / 0.9 g Fruits / Dietary Fiber (g)  Apple with skin, 1 medium / 4.4 g  Sweetened applesauce,  cup / 1.5 g  Banana,  medium / 1.5 g  Grapes, 10 grapes / 0.4 g  Orange, 1 small / 2.3 g  Raisin, 1.5 oz / 1.6 g  Melon, 1 cup / 1.4 g Vegetables / Dietary Fiber (g)  Green beans (canned),  cup /  1.3 g  Carrots (cooked),  cup / 2.3 g  Broccoli (cooked),  cup / 2.8 g  Peas (cooked),  cup / 4.4 g  Mashed potatoes,  cup / 1.6 g  Lettuce, 1 cup / 0.5 g  Corn (canned),  cup / 1.6 g  Tomato,  cup / 1.1 g Document Released: 05/31/2005 Document Revised: 11/30/2011 Document Reviewed: 09/02/2011 [facility] Patient Information 2014 [place], [place].

## 2012-12-14 NOTE — Progress Notes (Signed)
Primary Care Physician:  None (previously Ninfa Linden, NP) Primary GI: Dr. Darrick Penna   Chief Complaint  Patient presents with  . Abdominal Pain  . Gas  . Bloated    HPI:   Pleasant 27 year old female presents today in routine follow-up, history of gastritis and weight loss. Negative H.pylori on EGD last year. She has gained weight since Sept 2013, psychosocial stressors significantly improved.  Went to [facility] due to pelvic pain, diagnosed with adenomyosis. Went to ED June 4 due to lower abdominal pain, with overall negative CT. States she notes significant bloating, lower abdominal pain for about 6-7 months, thought was GYN related. Kendell Bane wants to do a hysterectomy. Intermittent. Notes intermittent constipation. Fatty food and greasy foods make worse. Trying to watch what she eats. Weight is increasing. No improvement with abdominal discomfort after defecation. No rectal bleeding. Sometimes up to 4 days without bowel movement.   Past Medical History  Diagnosis Date  . Hx of menorrhagia     ? endometriosis    Past Surgical History  Procedure Laterality Date  . Tubal ligation    . Esophagogastroduodenoscopy  02/08/2012    SLF:Non-erosive gastritis (inflammation) was found in the gastric antrum/ The mucosa of the esophagus appeared normal; multiple biopsies, path: negative h.pylori    Current Outpatient Prescriptions  Medication Sig Dispense Refill  . etonogestrel-ethinyl estradiol (NUVARING) 0.12-0.015 MG/24HR vaginal ring Place 1 each vaginally every 28 (twenty-eight) days. Insert vaginally and leave in place for 3 consecutive weeks, then remove for 1 week.      Marland Kitchen acetaminophen (TYLENOL) 500 MG tablet Take 1,000 mg by mouth every 6 (six) hours as needed for pain.      Marland Kitchen FLUoxetine (PROZAC) 20 MG capsule Take 20 mg by mouth daily.      Marland Kitchen ibuprofen (ADVIL,MOTRIN) 200 MG tablet Take 400 mg by mouth every 8 (eight) hours as needed for pain. For pain      .  oxyCODONE-acetaminophen (PERCOCET) 5-325 MG per tablet Take 1 tablet by mouth every 4 (four) hours as needed for pain.  20 tablet  0  . promethazine (PHENERGAN) 25 MG tablet Take 1 tablet (25 mg total) by mouth every 6 (six) hours as needed.  15 tablet  0   No current facility-administered medications for this visit.    Allergies as of 12/14/2012 - Review Complete 12/14/2012  Allergen Reaction Noted  . Sulfa antibiotics Rash 01/28/2012    Family History  Problem Relation Age of Onset  . Colon cancer Maternal Grandfather     History   Social History  . Marital Status: Legally Separated    Spouse Name: N/A    Number of Children: N/A  . Years of Education: N/A   Occupational History  . 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 Smoking status: Current Every Day Smoker -- 0.50 packs/day    Types: Cigarettes  . Smokeless tobacco: None  . Alcohol Use: No  . Drug Use: No  . Sexually Active: None   Other Topics Concern  . None   Social History Narrative  . None    Review of Systems: Negative unless otherwise mentioned.   Physical Exam: BP 97/60  Pulse 76  Temp(Src) 97.4 F (36.3 C) (Oral)  Ht 5\' 5"  (1.651 m)  Wt 129 lb 12.8 oz (58.877 kg)  BMI 21.6 kg/m2  LMP 11/13/2012 General:   Alert and oriented. No distress noted. Pleasant and  cooperative.  Head:  Normocephalic and atraumatic. Eyes:  Conjuctiva clear without scleral icterus. Mouth:  Oral mucosa pink and moist. Good dentition. No lesions. Abdomen:  +BS, soft, non-tender and non-distended. No rebound or guarding. No HSM or masses noted. Msk:  Symmetrical without gross deformities. Normal posture. Extremities:  Without edema. Neurologic:  Alert and  oriented x4;  grossly normal neurologically. Skin:  Intact without significant lesions or rashes. Psych:  Alert and cooperative. Normal mood and affect.

## 2012-12-16 DIAGNOSIS — R109 Unspecified abdominal pain: Secondary | ICD-10-CM | POA: Insufficient documentation

## 2012-12-16 NOTE — Assessment & Plan Note (Signed)
Resolved

## 2012-12-16 NOTE — Assessment & Plan Note (Signed)
Lower abdominal pain for last several months in the setting of constipation. Also seeing [facility] due to adenomyosis; informed she needs a hysterectomy but wants to avoid this currently. Negative pregnancy screen on file. Likely lower abdominal pain is multifactorial secondary to GYN and constipation. No rectal bleeding.  Start Amitiza 24 mcg daily, increase to BID if tolerates Progress report next week Start probiotic Return in 6 weeks

## 2012-12-18 NOTE — Progress Notes (Signed)
No PCP on File 

## 2013-01-30 ENCOUNTER — Ambulatory Visit: Payer: Medicaid Other | Admitting: Gastroenterology

## 2013-04-10 ENCOUNTER — Encounter (HOSPITAL_COMMUNITY): Payer: Self-pay | Admitting: Emergency Medicine

## 2013-04-10 ENCOUNTER — Emergency Department (HOSPITAL_COMMUNITY)
Admission: EM | Admit: 2013-04-10 | Discharge: 2013-04-10 | Disposition: A | Discharge status: Home / Self Care | Payer: Medicaid Other | Attending: Emergency Medicine | Admitting: Emergency Medicine

## 2013-04-10 DIAGNOSIS — Z9851 Tubal ligation status: Secondary | ICD-10-CM | POA: Insufficient documentation

## 2013-04-10 DIAGNOSIS — R1032 Left lower quadrant pain: Secondary | ICD-10-CM

## 2013-04-10 DIAGNOSIS — M5430 Sciatica, unspecified side: Secondary | ICD-10-CM | POA: Insufficient documentation

## 2013-04-10 DIAGNOSIS — M5432 Sciatica, left side: Secondary | ICD-10-CM

## 2013-04-10 DIAGNOSIS — Z8719 Personal history of other diseases of the digestive system: Secondary | ICD-10-CM | POA: Insufficient documentation

## 2013-04-10 DIAGNOSIS — N898 Other specified noninflammatory disorders of vagina: Secondary | ICD-10-CM | POA: Insufficient documentation

## 2013-04-10 DIAGNOSIS — F17200 Nicotine dependence, unspecified, uncomplicated: Secondary | ICD-10-CM | POA: Insufficient documentation

## 2013-04-10 DIAGNOSIS — Z79899 Other long term (current) drug therapy: Secondary | ICD-10-CM | POA: Insufficient documentation

## 2013-04-10 DIAGNOSIS — Z3202 Encounter for pregnancy test, result negative: Secondary | ICD-10-CM | POA: Insufficient documentation

## 2013-04-10 DIAGNOSIS — M545 Low back pain, unspecified: Secondary | ICD-10-CM | POA: Insufficient documentation

## 2013-04-10 LAB — CBC WITH DIFFERENTIAL/PLATELET
Basophils Absolute: 0 10*3/uL (ref 0.0–0.1)
Lymphocytes Relative: 45 % (ref 12–46)
Neutro Abs: 2.9 10*3/uL (ref 1.7–7.7)
Platelets: 199 10*3/uL (ref 150–400)
RDW: 12.9 % (ref 11.5–15.5)
WBC: 6.6 10*3/uL (ref 4.0–10.5)

## 2013-04-10 LAB — COMPREHENSIVE METABOLIC PANEL
Alkaline Phosphatase: 80 U/L (ref 39–117)
BUN: 11 mg/dL (ref 6–23)
Chloride: 101 mEq/L (ref 96–112)
GFR calc Af Amer: >90 mL/min (ref >90)
GFR calc non Af Amer: >90 mL/min (ref >90)
Glucose, Bld: 95 mg/dL (ref 70–99)
Potassium: 3.5 mEq/L (ref 3.5–5.1)
Total Bilirubin: 0.2 mg/dL — ABNORMAL LOW (ref 0.3–1.2)
Total Protein: 7.2 g/dL (ref 6.0–8.3)

## 2013-04-10 LAB — URINALYSIS, ROUTINE W REFLEX MICROSCOPIC
Bilirubin Urine: NEGATIVE
Ketones, ur: NEGATIVE mg/dL
Nitrite: NEGATIVE
pH: 6 (ref 5.0–8.0)

## 2013-04-10 MED ORDER — OXYCODONE-ACETAMINOPHEN 5-325 MG PO TABS: 1.0000 | ORAL_TABLET | ORAL | Status: DC | PRN

## 2013-04-10 MED ORDER — CELECOXIB 100 MG PO CAPS
ORAL_CAPSULE | ORAL | Status: AC
  Filled 2013-04-10: qty 2

## 2013-04-10 MED ORDER — CELECOXIB 200 MG PO CAPS
200.0000 mg | ORAL_CAPSULE | Freq: Once | ORAL | Status: AC
  Administered 2013-04-10: 200 mg via ORAL
  Filled 2013-04-10: qty 1

## 2013-04-10 MED ORDER — OXYCODONE-ACETAMINOPHEN 5-325 MG PO TABS
1.0000 | ORAL_TABLET | Freq: Once | ORAL | Status: AC
  Administered 2013-04-10: 1 via ORAL
  Filled 2013-04-10: qty 1

## 2013-04-10 MED ORDER — CYCLOBENZAPRINE HCL 10 MG PO TABS
10.0000 mg | ORAL_TABLET | Freq: Once | ORAL | Status: AC
  Administered 2013-04-10: 10 mg via ORAL
  Filled 2013-04-10: qty 1

## 2013-04-10 MED ORDER — CELECOXIB 200 MG PO CAPS: 200.0000 mg | ORAL_CAPSULE | Freq: Two times a day (BID) | ORAL | Status: DC

## 2013-04-10 MED ORDER — ORPHENADRINE CITRATE ER 100 MG PO TB12: 100.0000 mg | ORAL_TABLET | Freq: Two times a day (BID) | ORAL | Status: DC

## 2013-04-10 NOTE — ED Notes (Signed)
Pt alert & oriented x4, stable gait. Patient given discharge instructions, paperwork & prescription(s). Patient  instructed to stop at the registration desk to finish any additional paperwork. Patient verbalized understanding. Pt left department w/ no further questions. 

## 2013-04-10 NOTE — ED Notes (Signed)
Pt c/o lower back pain that radiates down left leg x 3 days and left abd pain x 3 weeks.

## 2013-04-10 NOTE — ED Provider Notes (Signed)
CSN: [id number]     Arrival date & time 04/10/13  2017 History   This chart was scribed for Dione Booze, MD by Bennett Scrape, ED Scribe. This patient was seen in room APA03/APA03 and the patient's care was started at 11:22 PM.   Chief Complaint  Patient presents with  . Back Pain  . Abdominal Pain    The history is provided by the patient. No language interpreter was used.    HPI Comments: Briana Norton is a 27 y.o. female who presents to the Emergency Department complaining of lower left-sided back pain that started gradually 3 days ago and radiates down her left buttocks. She rates her pain a 9 out of 10 currently and describes the pain as a burning sensation. She states that rest improves the pain. She states that bending over and twisting worsen the pain. She denies any recent falls or trauma. She states that she did some mild gardening which is normal for her prior to the pain starting.  She states that she has tried ice, heat and 'pain patches' and back ache OTC medications with no improvement. She denies any bowel or bladder incontinence and leg weakness or numbness.  She has a secondary complaint of persistent LLQ abdominal pain for the past 3 weeks. She states that the pain waxes and wanes with occasional localized swelling. She states that she has been using a heating pad with minimal improvement. She rates her pain a 6 out of 10 and denies any modifying factors. She has a h/o IBS and adenomyosis and describes those pains as intermittent cramping pains. She denies any fevers or chills. LNMP was one month ago. Pt states that she started having vaginal bleeding described as spotting today. She denies any vaginal discharge. She was following up with [facility] for the adenomyosis but states that she has not been able to continue due to lack of insurance. She states that she is currently being treated with birth control and was told that the next step was a hysterectomy.   PCP is with  Rulon Sera   Past Medical History  Diagnosis Date  . Hx of menorrhagia     ? endometriosis  . IBS (irritable bowel syndrome)    Past Surgical History  Procedure Laterality Date  . Tubal ligation    . Esophagogastroduodenoscopy  02/08/2012    SLF:Non-erosive gastritis (inflammation) was found in the gastric antrum/ The mucosa of the esophagus appeared normal; multiple biopsies, path: negative h.pylori   Family History  Problem Relation Age of Onset  . Colon cancer Maternal Grandfather    History  Substance Use Topics  . Smoking status: Current Every Day Smoker -- 0.50 packs/day    Types: Cigarettes  . Smokeless tobacco: Not on file  . Alcohol Use: No   No OB history provided.  Review of Systems  Constitutional: Negative for fever and chills.  Gastrointestinal: Positive for abdominal pain. Negative for nausea, vomiting and diarrhea.  Genitourinary: Positive for vaginal bleeding. Negative for vaginal discharge.  Musculoskeletal: Positive for back pain.  Neurological: Negative for weakness and numbness.  All other systems reviewed and are negative.    Allergies  Sulfa antibiotics  Home Medications   Current Outpatient Rx  Name  Route  Sig  Dispense  Refill  . acetaminophen (TYLENOL) 500 MG tablet   Oral   Take 1,000 mg by mouth every 6 (six) hours as needed for pain.         . busPIRone (BUSPAR)  5 MG tablet   Oral   Take 5 mg by mouth 2 (two) times daily.         . DULoxetine (CYMBALTA) 60 MG capsule   Oral   Take 60 mg by mouth daily.         Marland Kitchen lubiprostone (AMITIZA) 24 MCG capsule   Oral   Take 1 capsule (24 mcg total) by mouth 2 (two) times daily with a meal.   60 capsule   3   . ibuprofen (ADVIL,MOTRIN) 200 MG tablet   Oral   Take 400 mg by mouth every 8 (eight) hours as needed for pain. For pain          Triage Vitals: BP 101/66  Pulse 67  Temp(Src) 97.8 F (36.6 C) (Oral)  Resp 20  Ht 5\' 5"  (1.651 m)  Wt 130 lb (58.968 kg)   BMI 21.63 kg/m2  SpO2 98%  LMP 04/10/2013  Physical Exam  Nursing note and vitals reviewed. Constitutional: She is oriented to person, place, and time. She appears well-developed and well-nourished. No distress.  HENT:  Head: Normocephalic and atraumatic.  Eyes: EOM are normal.  Neck: Neck supple. No tracheal deviation present.  Cardiovascular: Normal rate, regular rhythm and normal heart sounds.   Pulmonary/Chest: Effort normal and breath sounds normal. No respiratory distress.  Abdominal: Soft. There is tenderness. There is no rebound and no guarding.  Musculoskeletal: Normal range of motion. She exhibits tenderness.  Mild tenderness to left para lumbar area with mild muscle spasm, positive SLR on the left at 60 degrees  Neurological: She is alert and oriented to person, place, and time.  Strength and sensation are intact   Skin: Skin is warm and dry.  Psychiatric: She has a normal mood and affect. Her behavior is normal.    ED Course  Procedures (including critical care time)  DIAGNOSTIC STUDIES: Oxygen Saturation is 98% on room air, normal by my interpretation.    COORDINATION OF CARE: 11:35 PM-Informed pt of radiology and lab work results. Discussed discharge plan which includes medications (Celebrex, Flexeril and Percocet) with pt and pt agreed to plan. Also advised pt to follow up within the next week as needed and pt agreed. Addressed symptoms to return for with pt. She has a ride home.  Labs Review Labs Reviewed  COMPREHENSIVE METABOLIC PANEL - Abnormal; Notable for the following:    Total Bilirubin 0.2 (*)    All other components within normal limits  URINALYSIS, ROUTINE W REFLEX MICROSCOPIC  PREGNANCY, URINE  CBC WITH DIFFERENTIAL   MDM   1. Left sided sciatica   2. Abdominal pain, LLQ    Left-sided sciatic pain. She states that she has GI intolerance to NSAIDs but not a true allergy. She is discharged with prescription for celecoxib, orphenadrine, and  oxycodone acetaminophen. She is to followup with her PCP. He noted legs to indicate spinal cord compression or cauda equina syndrome. No indication for emergent imaging.  I personally performed the services described in this documentation, which was scribed in my presence. The recorded information has been reviewed and is accurate.      Dione Booze, MD 04/11/13 0530

## 2013-04-16 MED FILL — Oxycodone w/ Acetaminophen Tab 5-325 MG: ORAL | Qty: 6 | Status: AC

## 2013-08-28 NOTE — Progress Notes (Signed)
REVIEWED.  

## 2013-11-12 ENCOUNTER — Emergency Department: Payer: Self-pay | Admitting: Emergency Medicine

## 2013-11-12 LAB — COMPREHENSIVE METABOLIC PANEL
ANION GAP: 4 — AB (ref 7–16)
Albumin: 3.9 g/dL (ref 3.4–5.0)
Alkaline Phosphatase: 95 U/L
BUN: 15 mg/dL (ref 7–18)
Bilirubin,Total: 0.4 mg/dL (ref 0.2–1.0)
CALCIUM: 9.1 mg/dL (ref 8.5–10.1)
CREATININE: 0.6 mg/dL (ref 0.60–1.30)
Chloride: 105 mmol/L (ref 98–107)
Co2: 30 mmol/L (ref 21–32)
EGFR (African American): >60
Glucose: 90 mg/dL (ref 65–99)
Osmolality: 278 (ref 275–301)
Potassium: 4.7 mmol/L (ref 3.5–5.1)
SGOT(AST): 13 U/L — ABNORMAL LOW (ref 15–37)
SGPT (ALT): 19 U/L (ref 12–78)
Sodium: 139 mmol/L (ref 136–145)
TOTAL PROTEIN: 7.5 g/dL (ref 6.4–8.2)

## 2013-11-12 LAB — URINALYSIS, COMPLETE
Bilirubin,UR: NEGATIVE
Blood: NEGATIVE
Glucose,UR: NEGATIVE mg/dL (ref 0–75)
Ketone: NEGATIVE
LEUKOCYTE ESTERASE: NEGATIVE
NITRITE: NEGATIVE
PH: 7 (ref 4.5–8.0)
Protein: NEGATIVE
Specific Gravity: 1.006 (ref 1.003–1.030)
WBC UR: 1 /HPF (ref 0–5)

## 2013-11-12 LAB — CBC WITH DIFFERENTIAL/PLATELET
BASOS ABS: 0 10*3/uL (ref 0.0–0.1)
BASOS PCT: 0.4 %
EOS PCT: 3 %
Eosinophil #: 0.2 10*3/uL (ref 0.0–0.7)
HCT: 43.6 % (ref 35.0–47.0)
HGB: 14.5 g/dL (ref 12.0–16.0)
Lymphocyte #: 1.7 10*3/uL (ref 1.0–3.6)
Lymphocyte %: 28.8 %
MCH: 30.8 pg (ref 26.0–34.0)
MCHC: 33.2 g/dL (ref 32.0–36.0)
MCV: 93 fL (ref 80–100)
MONOS PCT: 6.3 %
Monocyte #: 0.4 x10 3/mm (ref 0.2–0.9)
NEUTROS ABS: 3.7 10*3/uL (ref 1.4–6.5)
Neutrophil %: 61.5 %
Platelet: 154 10*3/uL (ref 150–440)
RBC: 4.69 10*6/uL (ref 3.80–5.20)
RDW: 13.2 % (ref 11.5–14.5)
WBC: 6 10*3/uL (ref 3.6–11.0)

## 2013-11-12 LAB — LIPASE, BLOOD: LIPASE: 71 U/L — AB (ref 73–393)

## 2014-09-24 ENCOUNTER — Ambulatory Visit
Admit: 2014-09-24 | Disposition: A | Discharge status: Home / Self Care | Payer: Self-pay | Attending: Family Medicine | Admitting: Family Medicine

## 2014-09-24 ENCOUNTER — Ambulatory Visit
Admit: 2014-09-24 | Disposition: A | Discharge status: Home / Self Care | Payer: Self-pay | Attending: Physician Assistant | Admitting: Physician Assistant

## 2014-09-24 LAB — COMPREHENSIVE METABOLIC PANEL
ANION GAP: 8 (ref 7–16)
Albumin: 4.8 g/dL
Alkaline Phosphatase: 62 U/L
BUN: 15 mg/dL
Bilirubin,Total: 0.6 mg/dL
CHLORIDE: 105 mmol/L
CREATININE: 0.55 mg/dL
Calcium, Total: 9.8 mg/dL
Co2: 27 mmol/L
EGFR (African American): >60
EGFR (Non-African Amer.): >60
Glucose: 96 mg/dL
POTASSIUM: 4 mmol/L
SGOT(AST): 19 U/L
SGPT (ALT): 14 U/L
Sodium: 140 mmol/L
Total Protein: 8 g/dL

## 2014-09-24 LAB — CBC WITH DIFFERENTIAL/PLATELET
BASOS ABS: 0 10*3/uL (ref 0.0–0.1)
Basophil %: 0.6 %
Eosinophil #: 0.1 10*3/uL (ref 0.0–0.7)
Eosinophil %: 2 %
HCT: 42 % (ref 35.0–47.0)
HGB: 14.3 g/dL (ref 12.0–16.0)
LYMPHS ABS: 2.4 10*3/uL (ref 1.0–3.6)
Lymphocyte %: 36.4 %
MCH: 30.5 pg (ref 26.0–34.0)
MCHC: 34 g/dL (ref 32.0–36.0)
MCV: 90 fL (ref 80–100)
MONO ABS: 0.4 x10 3/mm (ref 0.2–0.9)
Monocyte %: 5.7 %
Neutrophil #: 3.7 10*3/uL (ref 1.4–6.5)
Neutrophil %: 55.3 %
Platelet: 184 10*3/uL (ref 150–440)
RBC: 4.68 10*6/uL (ref 3.80–5.20)
RDW: 13 % (ref 11.5–14.5)
WBC: 6.7 10*3/uL (ref 3.6–11.0)

## 2014-09-24 LAB — AMYLASE: Amylase: 59 U/L

## 2014-09-24 LAB — LIPASE, BLOOD: LIPASE: 23 U/L

## 2014-09-24 LAB — PREGNANCY, URINE: Pregnancy Test, Urine: NEGATIVE m[IU]/mL

## 2014-10-02 ENCOUNTER — Other Ambulatory Visit: Payer: Self-pay

## 2014-10-02 ENCOUNTER — Encounter: Payer: Self-pay | Admitting: Nurse Practitioner

## 2014-10-02 ENCOUNTER — Ambulatory Visit (INDEPENDENT_AMBULATORY_CARE_PROVIDER_SITE_OTHER): Payer: BC Managed Care – PPO | Admitting: Nurse Practitioner

## 2014-10-02 VITALS — BP 113/64 | HR 85 | Temp 98.1°F | Ht 65.0 in | Wt 134.0 lb

## 2014-10-02 DIAGNOSIS — R5383 Other fatigue: Secondary | ICD-10-CM

## 2014-10-02 DIAGNOSIS — R1030 Lower abdominal pain, unspecified: Secondary | ICD-10-CM | POA: Diagnosis not present

## 2014-10-02 DIAGNOSIS — R109 Unspecified abdominal pain: Secondary | ICD-10-CM

## 2014-10-02 LAB — COMPREHENSIVE METABOLIC PANEL
ALK PHOS: 51 U/L (ref 39–117)
ALT: 9 U/L (ref 0–35)
AST: 13 U/L (ref 0–37)
Albumin: 4.3 g/dL (ref 3.5–5.2)
BILIRUBIN TOTAL: 0.4 mg/dL (ref 0.2–1.2)
BUN: 13 mg/dL (ref 6–23)
CO2: 23 meq/L (ref 19–32)
CREATININE: 0.68 mg/dL (ref 0.50–1.10)
Calcium: 9.5 mg/dL (ref 8.4–10.5)
Chloride: 105 mEq/L (ref 96–112)
Glucose, Bld: 95 mg/dL (ref 70–99)
Potassium: 4.4 mEq/L (ref 3.5–5.3)
Sodium: 138 mEq/L (ref 135–145)
Total Protein: 6.9 g/dL (ref 6.0–8.3)

## 2014-10-02 LAB — CBC WITH DIFFERENTIAL/PLATELET
BASOS PCT: 0 % (ref 0–1)
Basophils Absolute: 0 10*3/uL (ref 0.0–0.1)
Eosinophils Absolute: 0.1 10*3/uL (ref 0.0–0.7)
Eosinophils Relative: 1 % (ref 0–5)
HCT: 41.3 % (ref 36.0–46.0)
Hemoglobin: 14.3 g/dL (ref 12.0–15.0)
LYMPHS ABS: 1.8 10*3/uL (ref 0.7–4.0)
LYMPHS PCT: 28 % (ref 12–46)
MCH: 30.8 pg (ref 26.0–34.0)
MCHC: 34.6 g/dL (ref 30.0–36.0)
MCV: 89 fL (ref 78.0–100.0)
MONO ABS: 0.4 10*3/uL (ref 0.1–1.0)
MONOS PCT: 6 % (ref 3–12)
MPV: 10.8 fL (ref 8.6–12.4)
Neutro Abs: 4.1 10*3/uL (ref 1.7–7.7)
Neutrophils Relative %: 65 % (ref 43–77)
PLATELETS: 207 10*3/uL (ref 150–400)
RBC: 4.64 MIL/uL (ref 3.87–5.11)
RDW: 13.5 % (ref 11.5–15.5)
WBC: 6.3 10*3/uL (ref 4.0–10.5)

## 2014-10-02 LAB — LIPASE: LIPASE: 10 U/L (ref 0–75)

## 2014-10-02 MED ORDER — PEG 3350-KCL-NA BICARB-NACL 420 G PO SOLR: 4000.0000 mL | Freq: Once | ORAL | Status: DC

## 2014-10-02 NOTE — Progress Notes (Signed)
cc'ed to pcp °

## 2014-10-02 NOTE — Patient Instructions (Signed)
1. We will schedule your procedure (colonoscopy) for you 2. Have your labs drawn as soon as you can 3. Further recommendations to be based on the results of your procedure.

## 2014-10-02 NOTE — Assessment & Plan Note (Signed)
29-year-old female presents for continued and persistent abdominal pain. Has a history of endometriosis and was recommended to have a hysterectomy but is declined do this at this point.  Previous visit she was diagnosed with possible IBS and constipation in trial on Amitiza which she said did not help. Today she says her abdominal symptoms are again lower abdomen described as sharp and cramping. She had an extensive GI workup including CAT scans and x-rays and multiple labs. In order to complete her GI workup today we'll order a CBC, CMP, lipase, tissue transglutaminase IgA, and IgA. We'll also set her up for colonoscopy to further evaluate her persistent symptoms. Discussed with patient that these also come back normal as her previous tests have, would likely need to defer to GYN for possible referral to a tertiary [facility] for second opinion. We will have her return in 3 months for reevaluation.  Proceed with colonoscopy with Dr. Darrick PennaFields in the near future. The risks, benefits, and alternatives have been discussed in detail with the patient. They state understanding and desire to proceed.   Patient is not on any anticoagulants, antidiabetic medications, antidepressants, her long-term pain medications. Denies alcohol use and drug use.

## 2014-10-02 NOTE — Progress Notes (Signed)
Referring Provider: No ref. provider found Primary Care Physician:  Erasmo DownerStrader, Lindsey F, NP Primary GI:  Dr. Darrick PennaFields  Chief Complaint  Patient presents with  . Abdominal Pain    HPI:   29 year old female presents for bloating and abdominal pain. Last EGD 02/08/2012 for abdominal pain, subjective weight loss 3040 pounds, no change in bowel habits. Findings included normal esophageal mucosa, nonerosive gastritis, normal duodenal mucosa, no obvious source for symptoms. Recommended twice a day PPI, await biopsy, and follow-up loss this visit. Pathology showed benign small bowel mucosa, gastric body and antral type mucosa with mild chronic inflammation, benign squamous epithelial tissue from both esophagus biopsies. Last office visit 12/14/2012 with lower abdominal pain for several months in the setting of constipation also with a history of adenomyosis. Is holding off on hysterectomy at that time. No red flag or warning signs or symptoms at that time. Symptoms attributed to multifactorial cause including GYN and constipation, was started on Amitiza 24 g daily increased to twice a day if tolerated as well as a probiotic. Recommended six-week follow-up which was not completed.  Today she states she doesn't think her Amitiza helped her constipation or abdominal pain. And stopped taking it. Last week she went to urgent care because she was "bent over in pain" and they did XRays and tests and "came back with nothing." States her symptoms are persistent for the past several years. Last year was told by the ER she had a stomach ulcer and was given medication. Current symptoms include lower abdominal pain described as sharp and cramping. Has not had a hysterectomy. She continues to decline the procedure. Currently has a bowel movement every other day or every 2 days which she states is her baseline. Her stools are soft, formed, consistent with [place] 4 and without straining. Denies hematochezia, melena. Denies  fever, chills, unintentional weight loss, change in bowel habits. Admits decreased energy level. Denies any other upper or lower GI signs or symptoms.  Past Medical History  Diagnosis Date  . Hx of menorrhagia     ? endometriosis  . IBS (irritable bowel syndrome)   . Endometriosis     Past Surgical History  Procedure Laterality Date  . Tubal ligation    . Esophagogastroduodenoscopy  02/08/2012    SLF:Non-erosive gastritis (inflammation) was found in the gastric antrum/ The mucosa of the esophagus appeared normal; multiple biopsies, path: negative h.pylori    Current Outpatient Prescriptions  Medication Sig Dispense Refill  . polyethylene glycol-electrolytes (NULYTELY/GOLYTELY) 420 G solution Take 4,000 mLs by mouth once. 4000 mL 0   No current facility-administered medications for this visit.    Allergies as of 10/02/2014 - Review Complete 10/02/2014  Allergen Reaction Noted  . Sulfa antibiotics Rash 01/28/2012    Family History  Problem Relation Age of Onset  . Colon cancer Maternal Grandfather     History   Social History  . Marital Status: Single    Spouse Name: N/A  . Number of Children: N/A  . Years of Education: N/A   Occupational History  . 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 Smoking status: Former Smoker    Types: Cigarettes    Quit date: 04/02/2013  . Smokeless tobacco: Never Used  . Alcohol Use: No  . Drug Use: No  . Sexual Activity: Not on file   Other Topics Concern  . None   Social History Narrative    Review  of Systems: 10 point ROS negative except for HPI.   Physical Exam: BP 113/64 mmHg  Pulse 85  Temp(Src) 98.1 F (36.7 C) (Oral)  Ht  (1.651 m)  Wt 134 lb (60.782 kg)  BMI 22.30 kg/m2  LMP 09/06/2014 General:   Alert and oriented. No distress noted. Pleasant and cooperative.  Head:  Normocephalic and atraumatic. Eyes:  Conjuctiva clear without scleral icterus. Neck:   Supple, without mass or thyromegaly. Lungs:  Clear to auscultation bilaterally. No wheezes, rales, or rhonchi. No distress.  Heart:  S1, S2 present without murmurs, rubs, or gallops. Regular rate and rhythm. Abdomen:  +BS, soft, and non-distended. Mild TTP lower abdomen. No rebound or guarding. No HSM or masses noted. Msk:  Symmetrical without gross deformities. Normal posture. Extremities:  Without edema. Neurologic:  Alert and  oriented x4;  grossly normal neurologically. Skin:  Intact without significant lesions or rashes. Cervical Nodes:  No significant cervical adenopathy. Psych:  Alert and cooperative. Normal mood and affect.    10/02/2014 1:25 PM

## 2014-10-03 LAB — TISSUE TRANSGLUTAMINASE, IGA: TISSUE TRANSGLUTAMINASE AB, IGA: 1 U/mL (ref <4)

## 2014-10-03 LAB — IGA: IgA: 256 mg/dL (ref 69–380)

## 2014-10-09 ENCOUNTER — Telehealth: Payer: Self-pay | Admitting: Gastroenterology

## 2014-10-09 NOTE — Telephone Encounter (Signed)
PATIENT CALLED INQUIRING ABOUT LAB RESULTS  PLEASE CALL IF THEY ARE IN   [phone]

## 2014-10-10 NOTE — Telephone Encounter (Signed)
Pt is aware of normal labs.

## 2014-10-14 ENCOUNTER — Telehealth: Payer: Self-pay | Admitting: Gastroenterology

## 2014-10-14 NOTE — Telephone Encounter (Signed)
Pt called to say that [facility] will [facility]. She is aware of the 30 day time frame and said that she would call us back after tomorrow to let us know what she's going to do.

## 2014-10-15 ENCOUNTER — Ambulatory Visit (HOSPITAL_COMMUNITY)
Admission: RE | Admit: 2014-10-15 | Payer: BC Managed Care – PPO | Source: Ambulatory Visit | Admitting: Gastroenterology

## 2014-10-15 ENCOUNTER — Encounter (HOSPITAL_COMMUNITY): Admission: RE | Payer: Self-pay | Source: Ambulatory Visit

## 2014-10-15 SURGERY — COLONOSCOPY
Anesthesia: Moderate Sedation

## 2014-10-15 NOTE — Telephone Encounter (Signed)
Called short stay and made them aware that pt will be reschedule soon.

## 2014-10-22 NOTE — H&P (Signed)
L&D Evaluation:  History:   HPI [id number] with LMP of 3/11 with EDD of 06/16/10 [facility], [facility] with ? UC's, No LOF, Vag spotting lightly today and no decreased FM.    Presents with Vag spotting    Patient's Medical History No Chronic Illness    Patient's Surgical History none    Medications Pre Natal Vitamins    Allergies NKDA    Social History none    Family History Non-Contributory   ROS:   ROS All systems were reviewed.  HEENT, CNS, GI, GU, Respiratory, CV, Renal and Musculoskeletal systems were found to be normal.   Exam:   Vital Signs stable    General no apparent distress    Mental Status clear    Chest clear    Heart normal sinus rhythm, no murmur/gallop/rubs    Abdomen gravid, non-tender    Estimated Fetal Weight Average for gestational age    Back no CVAT    Edema 1+    Reflexes 1+    Clonus negative    Pelvic FT ext/closed/50% ? presenting part    Mebranes Intact    FHT normal rate with no decels    Ucx absent    Skin dry    Lymph no lymphadenopathy   Impression:   Impression IUP at 27 weeks with vag spotting, no spotting noted now   Plan:   Plan DC home   Electronic Signatures: Sharee PimpleJones, Caron W (CNM)  (Signed 04-Oct-11 18:45)  Entered: L&D Evaluation,  Authored: L&D Evaluation  Last Updated: 04-Oct-11 18:45

## 2015-01-01 ENCOUNTER — Telehealth: Payer: Self-pay | Admitting: Gastroenterology

## 2015-01-01 ENCOUNTER — Ambulatory Visit: Payer: BC Managed Care – PPO | Admitting: Nurse Practitioner

## 2015-01-01 ENCOUNTER — Encounter: Payer: Self-pay | Admitting: Nurse Practitioner

## 2015-01-01 NOTE — Telephone Encounter (Signed)
PATIENT WAS A NO SHOW AND LETTER WAS SENT  °

## 2015-01-01 NOTE — Telephone Encounter (Signed)
Noted  

## 2017-02-21 ENCOUNTER — Encounter: Payer: Self-pay | Admitting: *Deleted

## 2017-02-21 ENCOUNTER — Emergency Department
Admission: EM | Admit: 2017-02-21 | Discharge: 2017-02-21 | Disposition: A | Discharge status: Home / Self Care | Payer: 59 | Attending: Emergency Medicine | Admitting: Emergency Medicine

## 2017-02-21 DIAGNOSIS — R197 Diarrhea, unspecified: Secondary | ICD-10-CM | POA: Insufficient documentation

## 2017-02-21 DIAGNOSIS — Z87891 Personal history of nicotine dependence: Secondary | ICD-10-CM | POA: Diagnosis not present

## 2017-02-21 DIAGNOSIS — G542 Cervical root disorders, not elsewhere classified: Secondary | ICD-10-CM | POA: Insufficient documentation

## 2017-02-21 DIAGNOSIS — Z79899 Other long term (current) drug therapy: Secondary | ICD-10-CM | POA: Diagnosis not present

## 2017-02-21 DIAGNOSIS — M62838 Other muscle spasm: Secondary | ICD-10-CM | POA: Insufficient documentation

## 2017-02-21 DIAGNOSIS — R42 Dizziness and giddiness: Secondary | ICD-10-CM | POA: Diagnosis present

## 2017-02-21 LAB — BASIC METABOLIC PANEL
Anion gap: 9 (ref 5–15)
BUN: 13 mg/dL (ref 6–20)
CALCIUM: 9.4 mg/dL (ref 8.9–10.3)
CHLORIDE: 107 mmol/L (ref 101–111)
CO2: 24 mmol/L (ref 22–32)
CREATININE: 0.64 mg/dL (ref 0.44–1.00)
GFR calc Af Amer: >60 mL/min (ref >60)
GFR calc non Af Amer: >60 mL/min (ref >60)
GLUCOSE: 87 mg/dL (ref 65–99)
Potassium: 4 mmol/L (ref 3.5–5.1)
Sodium: 140 mmol/L (ref 135–145)

## 2017-02-21 LAB — CBC
HCT: 44.5 % (ref 35.0–47.0)
Hemoglobin: 15.3 g/dL (ref 12.0–16.0)
MCH: 31.7 pg (ref 26.0–34.0)
MCHC: 34.3 g/dL (ref 32.0–36.0)
MCV: 92.2 fL (ref 80.0–100.0)
PLATELETS: 207 10*3/uL (ref 150–440)
RBC: 4.82 MIL/uL (ref 3.80–5.20)
RDW: 13.7 % (ref 11.5–14.5)
WBC: 6.7 10*3/uL (ref 3.6–11.0)

## 2017-02-21 LAB — URINALYSIS, COMPLETE (UACMP) WITH MICROSCOPIC
Bilirubin Urine: NEGATIVE
Glucose, UA: NEGATIVE mg/dL
Ketones, ur: NEGATIVE mg/dL
Leukocytes, UA: NEGATIVE
Nitrite: NEGATIVE
PROTEIN: NEGATIVE mg/dL
SPECIFIC GRAVITY, URINE: 1.006 (ref 1.005–1.030)
pH: 5 (ref 5.0–8.0)

## 2017-02-21 LAB — POCT PREGNANCY, URINE: PREG TEST UR: NEGATIVE

## 2017-02-21 MED ORDER — NAPROXEN 500 MG PO TABS: 500.0000 mg | ORAL_TABLET | Freq: Two times a day (BID) | ORAL | 0 refills | Status: DC

## 2017-02-21 MED ORDER — PREDNISONE 10 MG (21) PO TBPK: ORAL_TABLET | ORAL | 0 refills | Status: DC

## 2017-02-21 NOTE — ED Triage Notes (Signed)
States for 1 week she has had dizzy with diarrhea, states this Am she developed left arm pain that went to her neck, states bone aches and chills, states she is currently on her menstrul cycle, states she had a tubal reversal 5 months ago and is trying to get pregnant,

## 2017-02-21 NOTE — ED Provider Notes (Signed)
[facility] Emergency Department Provider Note  ____________________________________________  Time seen: Approximately 12:30 PM  I have reviewed the triage vital signs and the nursing notes.   HISTORY  Chief Complaint Dizziness and Diarrhea    HPI Briana Norton is a 31 y.o. female who complains of left neck pain radiating down to her arm described as tingling that started this morning while she was driving after she drops her kids off at school. Pain is constant, worse with movement. No trauma. No unusual activities or heavy lifting. Denies headache vision changes nausea or vomiting. No fevers chills or sweats. Denies chest pain or shortness of breath. Denies motor weakness.  Patient also reports breath for the past week she's been feeling intermittently dizzy and having loose bowel movementsthat are not excessively frequent. Denies watery diarrhea. No abdominal pain. No vomiting, eating and drinking normally. She also reports she recently ran out of her clonazepam that she takes 2 times a day for anxiety due to changes in her insurance. She is continued taking her Zoloft without interruption.     Past Medical History:  Diagnosis Date  . Endometriosis   . Hx of menorrhagia    ? endometriosis  . IBS (irritable bowel syndrome)      Patient Active Problem List   Diagnosis Date Noted  . Fatigue 10/02/2014  . Abdominal pain 12/16/2012  . Chest pain 02/07/2012  . Odynophagia 02/07/2012     Past Surgical History:  Procedure Laterality Date  . ESOPHAGOGASTRODUODENOSCOPY  02/08/2012   SLF:Non-erosive gastritis (inflammation) was found in the gastric antrum/ The mucosa of the esophagus appeared normal; multiple biopsies, path: negative h.pylori  . TUBAL LIGATION       Prior to Admission medications   Medication Sig Start Date End Date Taking? Authorizing Provider  clonazePAM (KLONOPIN) 0.5 MG tablet Take 0.5 mg by mouth 2 (two) times daily.   Yes  [provider]  sertraline (ZOLOFT) 25 MG tablet Take 25 mg by mouth daily.   Yes [provider]  naproxen (NAPROSYN) 500 MG tablet Take 1 tablet (500 mg total) by mouth 2 (two) times daily with a meal. 02/21/17   Sharman CheekStafford, Terrilee Dudzik, MD  polyethylene glycol-electrolytes (NULYTELY/GOLYTELY) 420 G solution Take 4,000 mLs by mouth once. Patient not taking: Reported on 02/21/2017 10/02/14   West BaliFields, Sandi L, MD  predniSONE (STERAPRED UNI-PAK 21 TAB) 10 MG (21) TBPK tablet 6 tablets on day 1, then 5 tablets on day 2, then 4 tablets on day 3, then 3 tablets on day 4, then 2 tablets on day 5, then 1 tablet on day 6. 02/21/17   Sharman CheekStafford, Keiaira Donlan, MD     Allergies Sulfa antibiotics   Family History  Problem Relation Age of Onset  . Colon cancer Maternal Grandfather     Social History Social History  Substance Use Topics  . Smoking status: Former Smoker    Types: Cigarettes    Quit date: 04/02/2013  . Smokeless tobacco: Never Used  . Alcohol use No    Review of Systems  Constitutional:   No fever or chills.  ENT:   No sore throat. No rhinorrhea. Cardiovascular:   No chest pain or syncope. Respiratory:   No dyspnea or cough. Gastrointestinal:   Negative for abdominal pain, vomiting and diarrhea.  Musculoskeletal:   left neck and arm pain as above All other systems reviewed and are negative except as documented above in ROS and HPI.  ____________________________________________   PHYSICAL EXAM:  VITAL SIGNS:  ED Triage Vitals [02/21/17 1042]  Enc Vitals Group     BP 127/82     Pulse Rate 76     Resp 18     Temp 98.5 F (36.9 C)     Temp Source Oral     SpO2 99 %     Weight 141 lb (64 kg)     Height  (1.651 m)     Head Circumference      Peak Flow      Pain Score 8     Pain Loc      Pain Edu?      Excl. in GC?     Vital signs reviewed, nursing assessments reviewed.   Constitutional:   Alert and oriented. Well appearing and in no  distress.anxious appearing, looking at the floor Eyes:   No scleral icterus.  EOMI. No nystagmus. No conjunctival pallor. PERRL. ENT   Head:   Normocephalic and atraumatic.   Nose:   No congestion/rhinnorhea.    Mouth/Throat:   MMM, no pharyngeal erythema. No peritonsillar mass.    Neck:   No meningismus. Full ROM. No midline spinal tenderness. There is tenderness and tenseness of the left trapezius which reproduces some of her symptoms on palpation. Additionally, having her rotate her neck to the left and axial loading the cervical spine reproduces her symptoms. Hematological/Lymphatic/Immunilogical:   No cervical lymphadenopathy. Cardiovascular:   RRR. Symmetric bilateral radial and DP pulses.  No murmurs.  Respiratory:   Normal respiratory effort without tachypnea/retractions. Breath sounds are clear and equal bilaterally. No wheezes/rales/rhonchi. Gastrointestinal:   Soft and nontender. Non distended. There is no CVA tenderness.  No rebound, rigidity, or guarding. Genitourinary:   deferred Musculoskeletal:   Normal range of motion in all extremities. No joint effusions.  No lower extremity tenderness.  No edema.musculoskeletal findings of the neck as above. Neurologic:   Normal speech and language.  Motor grossly intact. subjectively diminished sensation in the left ulnar distribution. Tenderness over the ulnar nerve at the elbow. intact motor function in the hand including flexion and extension and lumbricals. Skin:    Skin is warm, dry and intact. No rash noted.  No petechiae, purpura, or bullae.  ____________________________________________    LABS (pertinent positives/negatives) (all labs ordered are listed, but only abnormal results are displayed) Labs Reviewed  URINALYSIS, COMPLETE (UACMP) WITH MICROSCOPIC - Abnormal; Notable for the following:       Result Value   Color, Urine STRAW (*)    APPearance CLEAR (*)    Hgb urine dipstick SMALL (*)    Bacteria, UA RARE  (*)    Squamous Epithelial / LPF 0-5 (*)    All other components within normal limits  BASIC METABOLIC PANEL  CBC  POCT PREGNANCY, URINE  CBG MONITORING, ED   ____________________________________________   EKG  interpreted by me  Date: 02/21/2017  Rate: 66  Rhythm: normal sinus rhythm  QRS Axis: normal  Intervals: normal  ST/T Wave abnormalities: normal  Conduction Disutrbances: none  Narrative Interpretation: unremarkable      ____________________________________________    RADIOLOGY  No results found.  ____________________________________________   PROCEDURES Procedures  ____________________________________________   INITIAL IMPRESSION / ASSESSMENT AND PLAN / ED COURSE  Pertinent labs & imaging results that were available during my care of the patient were reviewed by me and considered in my medical decision making (see chart for details).  patient well appearing no acute distress presents with neck and left upper extremity pain, consistent with  a musculoskeletal syndrome related to cervical radiculopathy as well as trapezius spasm. The patient take a short course of steroids and NSAIDs. Heat therapy, follow-up with primary care and orthopedics. She also appears to be very anxious which may be exacerbating her symptoms. This appears to be due to running out of her Klonopin. No symptoms of frank benzodiazepine withdrawal, and her dosage is low enough that I do not think this will be an issue for her.Considering the patient's symptoms, medical history, and physical examination today, I have low suspicion for ACS, PE, TAD, pneumothorax, carditis, mediastinitis, pneumonia, CHF, or sepsis.  Very low suspicion for stroke or carotid dissection.      ____________________________________________   FINAL CLINICAL IMPRESSION(S) / ED DIAGNOSES  Final diagnoses:  Neck muscle spasm  Cervical nerve root impingement      New Prescriptions   NAPROXEN (NAPROSYN)  500 MG TABLET    Take 1 tablet (500 mg total) by mouth 2 (two) times daily with a meal.   PREDNISONE (STERAPRED UNI-PAK 21 TAB) 10 MG (21) TBPK TABLET    6 tablets on day 1, then 5 tablets on day 2, then 4 tablets on day 3, then 3 tablets on day 4, then 2 tablets on day 5, then 1 tablet on day 6.     Portions of this note were generated with dragon dictation software. Dictation errors may occur despite best attempts at proofreading.    Sharman Cheek, MD 02/21/17 1239

## 2017-02-21 NOTE — ED Notes (Signed)
NAD noted at time of D/C. Pt denies questions or concerns. Pt ambulatory to the lobby at this time.  

## 2017-10-04 ENCOUNTER — Encounter: Payer: Self-pay | Admitting: Emergency Medicine

## 2017-10-04 ENCOUNTER — Emergency Department: Payer: 59

## 2017-10-04 ENCOUNTER — Emergency Department
Admission: EM | Admit: 2017-10-04 | Discharge: 2017-10-04 | Disposition: A | Discharge status: Home / Self Care | Payer: 59 | Attending: Emergency Medicine | Admitting: Emergency Medicine

## 2017-10-04 ENCOUNTER — Other Ambulatory Visit: Payer: Self-pay

## 2017-10-04 DIAGNOSIS — Z79899 Other long term (current) drug therapy: Secondary | ICD-10-CM | POA: Diagnosis not present

## 2017-10-04 DIAGNOSIS — Z87891 Personal history of nicotine dependence: Secondary | ICD-10-CM | POA: Insufficient documentation

## 2017-10-04 DIAGNOSIS — R1084 Generalized abdominal pain: Secondary | ICD-10-CM

## 2017-10-04 DIAGNOSIS — R1013 Epigastric pain: Secondary | ICD-10-CM

## 2017-10-04 DIAGNOSIS — R1012 Left upper quadrant pain: Secondary | ICD-10-CM

## 2017-10-04 DIAGNOSIS — R109 Unspecified abdominal pain: Secondary | ICD-10-CM | POA: Diagnosis present

## 2017-10-04 LAB — COMPREHENSIVE METABOLIC PANEL
ALBUMIN: 4.2 g/dL (ref 3.5–5.0)
ALT: 12 U/L — AB (ref 14–54)
AST: 21 U/L (ref 15–41)
Alkaline Phosphatase: 79 U/L (ref 38–126)
Anion gap: 6 (ref 5–15)
BILIRUBIN TOTAL: 0.4 mg/dL (ref 0.3–1.2)
BUN: 18 mg/dL (ref 6–20)
CO2: 23 mmol/L (ref 22–32)
CREATININE: 0.58 mg/dL (ref 0.44–1.00)
Calcium: 8.8 mg/dL — ABNORMAL LOW (ref 8.9–10.3)
Chloride: 107 mmol/L (ref 101–111)
GFR calc Af Amer: >60 mL/min (ref >60)
GLUCOSE: 89 mg/dL (ref 65–99)
POTASSIUM: 3.8 mmol/L (ref 3.5–5.1)
Sodium: 136 mmol/L (ref 135–145)
TOTAL PROTEIN: 6.9 g/dL (ref 6.5–8.1)

## 2017-10-04 LAB — CBC
HEMATOCRIT: 43.7 % (ref 35.0–47.0)
Hemoglobin: 14.9 g/dL (ref 12.0–16.0)
MCH: 31.1 pg (ref 26.0–34.0)
MCHC: 34 g/dL (ref 32.0–36.0)
MCV: 91.4 fL (ref 80.0–100.0)
PLATELETS: 185 10*3/uL (ref 150–440)
RBC: 4.78 MIL/uL (ref 3.80–5.20)
RDW: 13.9 % (ref 11.5–14.5)
WBC: 6.1 10*3/uL (ref 3.6–11.0)

## 2017-10-04 LAB — URINALYSIS, COMPLETE (UACMP) WITH MICROSCOPIC
Bilirubin Urine: NEGATIVE
GLUCOSE, UA: NEGATIVE mg/dL
Hgb urine dipstick: NEGATIVE
Ketones, ur: NEGATIVE mg/dL
NITRITE: NEGATIVE
PH: 5 (ref 5.0–8.0)
Protein, ur: NEGATIVE mg/dL
SPECIFIC GRAVITY, URINE: 1.009 (ref 1.005–1.030)

## 2017-10-04 LAB — LIPASE, BLOOD: Lipase: 22 U/L (ref 11–51)

## 2017-10-04 MED ORDER — OMEPRAZOLE 20 MG PO CPDR: 20.0000 mg | DELAYED_RELEASE_CAPSULE | Freq: Two times a day (BID) | ORAL | 1 refills | Status: DC

## 2017-10-04 MED ORDER — GI COCKTAIL ~~LOC~~
30.0000 mL | Freq: Once | ORAL | Status: AC
  Administered 2017-10-04: 30 mL via ORAL
  Filled 2017-10-04: qty 30

## 2017-10-04 NOTE — Discharge Instructions (Signed)
Your labs and imaging were unremarkable for acute findings.  Start Prilosec twice a day until evaluation by gastroenterologist.

## 2017-10-04 NOTE — ED Provider Notes (Signed)
[facility] Emergency Department Provider Note   ____________________________________________   First MD Initiated Contact with Patient 10/04/17 1327     (approximate)  I have reviewed the triage vital signs and the nursing notes.   HISTORY  Chief Complaint Abdominal Pain    HPI Briana Norton is a 32 y.o. female patient complain epigastric pain for 2 weeks.  Patient state no relief over-the-counter antacids.  Patient states pain not related to eating.  Patient rates the pain as 8/10.  Patient described the pain as"stabbing".  Patient stated nausea without vomiting.  Patient denies constipation.  Patient has intermittent upper and lower abdominal pain for the past 3 years with no acute findings on imaging the lab test.  Last GI consult was approximately 3 years ago.   Past Medical History:  Diagnosis Date  . Endometriosis   . Hx of menorrhagia    ? endometriosis  . IBS (irritable bowel syndrome)     Patient Active Problem List   Diagnosis Date Noted  . Fatigue 10/02/2014  . Abdominal pain 12/16/2012  . Chest pain 02/07/2012  . Odynophagia 02/07/2012    Past Surgical History:  Procedure Laterality Date  . ESOPHAGOGASTRODUODENOSCOPY  02/08/2012   SLF:Non-erosive gastritis (inflammation) was found in the gastric antrum/ The mucosa of the esophagus appeared normal; multiple biopsies, path: negative h.pylori  . TUBAL LIGATION    . tubal reversal      Prior to Admission medications   Medication Sig Start Date End Date Taking? Authorizing Provider  clonazePAM (KLONOPIN) 0.5 MG tablet Take 0.5 mg by mouth 2 (two) times daily.    [provider]  naproxen (NAPROSYN) 500 MG tablet Take 1 tablet (500 mg total) by mouth 2 (two) times daily with a meal. 02/21/17   Sharman Cheek, MD  omeprazole (PRILOSEC) 20 MG capsule Take 1 capsule (20 mg total) by mouth 2 (two) times daily. 10/04/17 10/04/18  Joni Reining, PA-C  polyethylene  glycol-electrolytes (NULYTELY/GOLYTELY) 420 G solution Take 4,000 mLs by mouth once. Patient not taking: Reported on 02/21/2017 10/02/14   West Bali, MD  predniSONE (STERAPRED UNI-PAK 21 TAB) 10 MG (21) TBPK tablet 6 tablets on day 1, then 5 tablets on day 2, then 4 tablets on day 3, then 3 tablets on day 4, then 2 tablets on day 5, then 1 tablet on day 6. 02/21/17   Sharman Cheek, MD  sertraline (ZOLOFT) 25 MG tablet Take 25 mg by mouth daily.    [provider]    Allergies Sulfa antibiotics  Family History  Problem Relation Age of Onset  . Colon cancer Maternal Grandfather     Social History Social History   Tobacco Use  . Smoking status: Former Smoker    Types: Cigarettes    Last attempt to quit: 04/02/2013    Years since quitting: 4.5  . Smokeless tobacco: Never Used  Substance Use Topics  . Alcohol use: No    Alcohol/week: 0.0 oz  . Drug use: No    Review of Systems  Constitutional: No fever/chills Eyes: No visual changes. ENT: No sore throat. Cardiovascular: Denies chest pain. Respiratory: Denies shortness of breath. Gastrointestinal: Epigastric pain.  Nausea without vomiting.  No diarrhea.  No constipation. Genitourinary: Negative for dysuria. Musculoskeletal: Negative for back pain. Skin: Negative for rash. Neurological: Negative for headaches, focal weakness or numbness. Allergic/Immunilogical: Sulfa antibiotics ____________________________________________   PHYSICAL EXAM:  VITAL SIGNS: ED Triage Vitals  Enc Vitals Group  BP 10/04/17 1224 121/81     Pulse Rate 10/04/17 1224 85     Resp 10/04/17 1224 14     Temp 10/04/17 1224 97.9 F (36.6 C)     Temp Source 10/04/17 1224 Oral     SpO2 10/04/17 1224 100 %     Weight 10/04/17 1225 132 lb (59.9 kg)     Height 10/04/17 1225 5\' 5"  (1.651 m)     Head Circumference --      Peak Flow --      Pain Score 10/04/17 1224 8     Pain Loc --      Pain Edu? --      Excl. in GC? --     Constitutional: Alert and oriented. Well appearing and in no acute distress. Cardiovascular: Normal rate, regular rhythm. Grossly normal heart sounds.  Good peripheral circulation. Respiratory: Normal respiratory effort.  No retractions. Lungs CTAB. Gastrointestinal: Soft and nontender. No distention. No abdominal bruits. No CVA tenderness. Genitourinary: Deferred Neurologic:  Normal speech and language. No gross focal neurologic deficits are appreciated. No gait instability. Skin:  Skin is warm, dry and intact. No rash noted. Psychiatric: Mood and affect are normal. Speech and behavior are normal.  ____________________________________________   LABS (all labs ordered are listed, but only abnormal results are displayed)  Labs Reviewed  COMPREHENSIVE METABOLIC PANEL - Abnormal; Notable for the following components:      Result Value   Calcium 8.8 (*)    ALT 12 (*)    All other components within normal limits  URINALYSIS, COMPLETE (UACMP) WITH MICROSCOPIC - Abnormal; Notable for the following components:   Color, Urine STRAW (*)    APPearance CLEAR (*)    Leukocytes, UA SMALL (*)    Bacteria, UA FEW (*)    All other components within normal limits  LIPASE, BLOOD  CBC  PREGNANCY, URINE   ____________________________________________  EKG   ____________________________________________  RADIOLOGY  Official radiology report(s): US Abdomen Complete  Result Date: 10/04/2017 CLINICAL DATA:  Epigastric and left upper quadrant pain. EXAM: ABDOMEN ULTRASOUND COMPLETE COMPARISON:  None. FINDINGS: Gallbladder: No gallstones or wall thickening visualized. No sonographic Murphy sign noted by sonographer. Common bile duct: Diameter: 2.3 mm Liver: No focal lesion identified. Within normal limits in parenchymal echogenicity. Portal vein is patent on color Doppler imaging with normal direction of blood flow towards the liver. IVC: No abnormality visualized. Pancreas: Visualized portion  unremarkable. Spleen: Size and appearance within normal limits. Right Kidney: Length: 10.7 cm. Echogenicity within normal limits. No mass or hydronephrosis visualized. Left Kidney: Length: 10.2 cm. Echogenicity within normal limits. No mass or hydronephrosis visualized. Abdominal aorta: No aneurysm visualized. Other findings: None. IMPRESSION: 1. No cause for the patient's symptoms identified. Electronically Signed   By: Gerome Sam III M.D   On: 10/04/2017 15:05    ____________________________________________   PROCEDURES  Procedure(s) performed: None  Procedures  Critical Care performed: No  ____________________________________________   INITIAL IMPRESSION / ASSESSMENT AND PLAN / ED COURSE  As part of my medical decision making, I reviewed the following data within the electronic MEDICAL RECORD NUMBER    Patient presents with epigastric pain for 2 weeks.  Patient had no relief over-the-counter antacids.  Discussed negative lab and imaging results with patient.  Patient given discharge care instruction advised to follow-up with gastroenterology for definitive evaluation and treatment.   ____________________________________________   FINAL CLINICAL IMPRESSION(S) / ED DIAGNOSES  Final diagnoses:  Epigastric pain     ED  Discharge Orders        Ordered    omeprazole (PRILOSEC) 20 MG capsule  2 times daily     10/04/17 1515       Note:  This document was prepared using Dragon voice recognition software and may include unintentional dictation errors.    Joni ReiningSmith, Ronald K, PA-C 10/04/17 1523    Arnaldo NatalMalinda, Paul F, MD 10/04/17 [id number]

## 2017-10-04 NOTE — ED Notes (Signed)
Pt ambulatory to POV without difficulty. VSS. NAD. Pt is upset that we could not find anything in her test results today for the pain she is having. Explained that she will need to follow up with GI they may need to do more testing. Discharge instructions, RX and follow up discussed. All questions addressed.

## 2017-10-04 NOTE — ED Triage Notes (Signed)
Says epigastric pain for 2 weeks.  Tried antacids and 14 day acid tx without relief.

## 2017-10-06 ENCOUNTER — Encounter: Payer: Self-pay | Admitting: Gastroenterology

## 2017-10-06 ENCOUNTER — Ambulatory Visit: Payer: 59 | Admitting: Gastroenterology

## 2017-10-06 VITALS — BP 109/69 | HR 84 | Ht 65.0 in | Wt 134.0 lb

## 2017-10-06 DIAGNOSIS — R1310 Dysphagia, unspecified: Secondary | ICD-10-CM | POA: Diagnosis not present

## 2017-10-06 DIAGNOSIS — R109 Unspecified abdominal pain: Secondary | ICD-10-CM

## 2017-10-06 NOTE — Progress Notes (Signed)
Briana MoodKiran Taisha Pennebaker MD, MRCP(U.K) [address]  [address]  [place], [place] [address]  Main: [phone]  Fax: [phone]   Gastroenterology Consultation  Referring Provider:     Erasmo Norton, Briana F, NP Primary Care Physician:  Briana Norton, Briana F, NP Primary Gastroenterologist:  Dr. Wyline MoodKiran Sydna Norton  Reason for Consultation:     Abdominal pain         HPI:   Briana Norton is a 32 y.o. y/o female referred for consultation & management  by Dr. Iran OuchStrader, Briana BorneLindsey F, NP.    She has been referred for abdominal pain. She was last seen by [facility] in 2016 for abdominal pain and carried a diagnosis of IBS-C, bloating . No prior colonoscopy but EGD in 2013 showed gastritis. She has a prior history of endometriosis. She was seen at the ER on 10/04/17 for epigastric pain of 2 weeks duration . USG abdomen showed no acute findings. Few leucocytes and bacteria in urine. CBC,CMP,Lipase -normal . No issues with urination , no dysuria, no increased frequency    Abdominal pain: Onset: Says she was on a low carbohydrate diet, lost weight , felt better , felt she could not tolerate carbohydrates the lower abdomen - 2 years back. Subsequently everything was good, had two surgeries for tubal ligation and then revision , gained some weight . In 04/2017 joined a gym with a Systems analystpersonal trainer and has lost 10 lbs, feeling really well. 2 weeks back all of a sudden developed some central abdominal pain , had some heartburn , took some gaviscon which helped. Would get an episode of pain once every 2 days, each episode would last the whole day  Site :epigastric  Radiation: to the back  Severity :at times would 9/10- no similar pains in the past  Nature of pain: like a knife , takes her breath away  Aggravating factors: green beans  Relieving factors :GI cocktail  Weight loss: some  NSAID use: takes ibuprofen -once a week for headaches. - none recently  PPI use :started omeprazole 20 , last 4 days on an empty stomach -  no difference so far  Gall bladder surgery: intact  Frequency of bowel movements: daily-soft  Change in bowel movements: no , some days a bit watery  Relief with bowel movements: less bloated  Gas/Bloating/Abdominal distension: yes   No artificial sugars in diet   Has pain during her periods which is different in nature . She has some burning sensation , urgency after sexual intercourse.   S**he has been referred for difficulty with swallowing   Dysphagia: Onset and any progression:  2 weeks , food gets stuck in the middle of her chest  Frequency: almost every meal  Foods affected : only solids  Prior episodes of impaction: no  History of asthma/allergy : no  History of heartburn/Reflux : yes  Weight loss/weight gain : no   Prior EGD: yes  PPI/H2 blocker use : yes   Past Medical History:  Diagnosis Date  . Endometriosis   . Hx of menorrhagia    ? endometriosis  . IBS (irritable bowel syndrome)     Past Surgical History:  Procedure Laterality Date  . ESOPHAGOGASTRODUODENOSCOPY  02/08/2012   SLF:Non-erosive gastritis (inflammation) was found in the gastric antrum/ The mucosa of the esophagus appeared normal; multiple biopsies, path: negative h.pylori  . TUBAL LIGATION    . tubal reversal      Prior to Admission medications   Medication Sig Start Date End Date  Taking? Authorizing Provider  clonazePAM (KLONOPIN) 0.5 MG tablet Take 0.5 mg by mouth 2 (two) times daily.   Yes [provider]    Family History  Problem Relation Age of Onset  . Colon cancer Maternal Grandfather      Social History   Tobacco Use  . Smoking status: Former Smoker    Types: Cigarettes    Last attempt to quit: 04/02/2013    Years since quitting: 4.5  . Smokeless tobacco: Never Used  Substance Use Topics  . Alcohol use: No    Alcohol/week: 0.0 oz  . Drug use: No    Allergies as of 10/06/2017 - Review Complete 10/06/2017  Allergen Reaction Noted  . Sulfa antibiotics Rash  01/28/2012    Review of Systems:    All systems reviewed and negative except where noted in HPI.   Physical Exam:  BP 109/69 (BP Location: Right Arm, Patient Position: Sitting, Cuff Size: Normal)   Pulse 84   Ht 5\' 5"  (1.651 m)   Wt 134 lb (60.8 kg)   BMI 22.30 kg/m  No LMP recorded. Psych:  Alert and cooperative. Normal mood and affect. General:   Alert,  Well-developed, well-nourished, pleasant and cooperative in NAD Head:  Normocephalic and atraumatic. Eyes:  Sclera clear, no icterus.   Conjunctiva pink. Ears:  Normal auditory acuity. Nose:  No deformity, discharge, or lesions. Mouth:  No deformity or lesions,oropharynx pink & moist. Neck:  Supple; no masses or thyromegaly. Lungs:  Respirations even and unlabored.  Clear throughout to auscultation.   No wheezes, crackles, or rhonchi. No acute distress. Heart:  Regular rate and rhythm; no murmurs, clicks, rubs, or gallops. Abdomen:  Normal bowel sounds.  No bruits.  Soft, non-tender and non-distended without masses, hepatosplenomegaly or hernias noted.  No guarding or rebound tenderness.    Msk:  Symmetrical without gross deformities. Good, equal movement & strength bilaterally. Pulses:  Normal pulses noted. Extremities:  No clubbing or edema.  No cyanosis. Neurologic:  Alert and oriented x3;  grossly normal neurologically. Skin:  Intact without significant lesions or rashes. No jaundice. Lymph Nodes:  No significant cervical adenopathy. Psych:  Alert and cooperative. Normal mood and affect.  Imaging Studies: US Abdomen Complete  Result Date: 10/04/2017 CLINICAL DATA:  Epigastric and left upper quadrant pain. EXAM: ABDOMEN ULTRASOUND COMPLETE COMPARISON:  None. FINDINGS: Gallbladder: No gallstones or wall thickening visualized. No sonographic Murphy sign noted by sonographer. Common bile duct: Diameter: 2.3 mm Liver: No focal lesion identified. Within normal limits in parenchymal echogenicity. Portal vein is patent on color  Doppler imaging with normal direction of blood flow towards the liver. IVC: No abnormality visualized. Pancreas: Visualized portion unremarkable. Spleen: Size and appearance within normal limits. Right Kidney: Length: 10.7 cm. Echogenicity within normal limits. No mass or hydronephrosis visualized. Left Kidney: Length: 10.2 cm. Echogenicity within normal limits. No mass or hydronephrosis visualized. Abdominal aorta: No aneurysm visualized. Other findings: None. IMPRESSION: 1. No cause for the patient's symptoms identified. Electronically Signed   By: Gerome Sam III M.D   On: 10/04/2017 15:05    Assessment and Plan:   Briana Norton is a 32 y.o. y/o female has been referred for a 2 week history of epigastric discomfort. Ashby Dawes of her pain is different from her prior abdominal pain. Her menstrual cycle is delayed so far by 2 weeks . She also has dysphagia for solids which is new    Plan  1. Stool H pylori antigen  2. PPI 40  mg a day increase from 20 mg a day  3. EGD+/- dilation after ruling out pregnancy.    I have discussed alternative options, risks & benefits,  which include, but are not limited to, bleeding, infection, perforation,respiratory complication & drug reaction.  The patient agrees with this plan & written consent will be obtained.     I have discussed alternative options, risks & benefits,  which include, but are not limited to, bleeding, infection, perforation,respiratory complication & drug reaction.  The patient agrees with this plan & written consent will be obtained.      Follow up in 6 weeks  Dr Briana Mood MD,MRCP(U.K)

## 2017-10-06 NOTE — Patient Instructions (Signed)
1. Complete lab tests.   2. Pick-up prescription.  3. Prepare for EGD. Following instructions given.

## 2017-10-08 LAB — PREGNANCY, URINE: Preg Test, Ur: NEGATIVE

## 2017-10-09 ENCOUNTER — Encounter: Payer: Self-pay | Admitting: Gastroenterology

## 2017-10-09 LAB — H. PYLORI ANTIGEN, STOOL: H pylori Ag, Stl: NEGATIVE

## 2017-10-10 ENCOUNTER — Encounter: Payer: Self-pay | Admitting: *Deleted

## 2017-10-11 ENCOUNTER — Other Ambulatory Visit: Payer: Self-pay

## 2017-10-11 ENCOUNTER — Encounter: Payer: Self-pay | Admitting: *Deleted

## 2017-10-11 ENCOUNTER — Encounter
Admission: RE | Disposition: A | Discharge status: Home / Self Care | Payer: Self-pay | Source: Ambulatory Visit | Attending: Gastroenterology

## 2017-10-11 ENCOUNTER — Ambulatory Visit: Payer: 59 | Admitting: Certified Registered"

## 2017-10-11 ENCOUNTER — Ambulatory Visit
Admission: RE | Admit: 2017-10-11 | Discharge: 2017-10-11 | Disposition: A | Discharge status: Home / Self Care | Payer: 59 | Source: Ambulatory Visit | Attending: Gastroenterology | Admitting: Gastroenterology

## 2017-10-11 DIAGNOSIS — K219 Gastro-esophageal reflux disease without esophagitis: Secondary | ICD-10-CM | POA: Diagnosis not present

## 2017-10-11 DIAGNOSIS — Z79899 Other long term (current) drug therapy: Secondary | ICD-10-CM | POA: Insufficient documentation

## 2017-10-11 DIAGNOSIS — R109 Unspecified abdominal pain: Secondary | ICD-10-CM

## 2017-10-11 DIAGNOSIS — K2950 Unspecified chronic gastritis without bleeding: Secondary | ICD-10-CM | POA: Diagnosis not present

## 2017-10-11 DIAGNOSIS — Z800 Family history of malignant neoplasm of digestive organs: Secondary | ICD-10-CM | POA: Diagnosis not present

## 2017-10-11 DIAGNOSIS — Z87891 Personal history of nicotine dependence: Secondary | ICD-10-CM | POA: Insufficient documentation

## 2017-10-11 DIAGNOSIS — K589 Irritable bowel syndrome without diarrhea: Secondary | ICD-10-CM | POA: Insufficient documentation

## 2017-10-11 DIAGNOSIS — K259 Gastric ulcer, unspecified as acute or chronic, without hemorrhage or perforation: Secondary | ICD-10-CM | POA: Diagnosis not present

## 2017-10-11 DIAGNOSIS — Z882 Allergy status to sulfonamides status: Secondary | ICD-10-CM | POA: Insufficient documentation

## 2017-10-11 DIAGNOSIS — R1310 Dysphagia, unspecified: Secondary | ICD-10-CM | POA: Insufficient documentation

## 2017-10-11 DIAGNOSIS — F419 Anxiety disorder, unspecified: Secondary | ICD-10-CM | POA: Insufficient documentation

## 2017-10-11 DIAGNOSIS — R1030 Lower abdominal pain, unspecified: Secondary | ICD-10-CM

## 2017-10-11 HISTORY — PX: ESOPHAGOGASTRODUODENOSCOPY (EGD) WITH PROPOFOL: SHX5813

## 2017-10-11 LAB — POCT PREGNANCY, URINE: Preg Test, Ur: NEGATIVE

## 2017-10-11 SURGERY — ESOPHAGOGASTRODUODENOSCOPY (EGD) WITH PROPOFOL
Anesthesia: General

## 2017-10-11 MED ORDER — SODIUM CHLORIDE 0.9 % IV SOLN
INTRAVENOUS | Status: DC
  Administered 2017-10-11: 1000 mL via INTRAVENOUS

## 2017-10-11 MED ORDER — LACTULOSE 20 G PO PACK: 20.0000 g | PACK | Freq: Three times a day (TID) | ORAL | 0 refills | Status: DC

## 2017-10-11 MED ORDER — LIDOCAINE HCL (CARDIAC) PF 100 MG/5ML IV SOSY
PREFILLED_SYRINGE | INTRAVENOUS | Status: DC | PRN
  Administered 2017-10-11: 40 mg via INTRATRACHEAL

## 2017-10-11 MED ORDER — PROPOFOL 10 MG/ML IV BOLUS
INTRAVENOUS | Status: DC | PRN
  Administered 2017-10-11 (×2): 50 mg via INTRAVENOUS
  Administered 2017-10-11: 100 mg via INTRAVENOUS
  Administered 2017-10-11: 50 mg via INTRAVENOUS

## 2017-10-11 MED ORDER — PROPOFOL 500 MG/50ML IV EMUL
INTRAVENOUS | Status: AC
  Filled 2017-10-11: qty 50

## 2017-10-11 MED ORDER — LIDOCAINE HCL (PF) 2 % IJ SOLN
INTRAMUSCULAR | Status: AC
  Filled 2017-10-11: qty 10

## 2017-10-11 MED ORDER — PLECANATIDE 3 MG PO TABS: 3.0000 mg | ORAL_TABLET | Freq: Every day | ORAL | 0 refills | Status: DC

## 2017-10-11 MED ORDER — LINACLOTIDE 145 MCG PO CAPS: 145.0000 ug | ORAL_CAPSULE | Freq: Every day | ORAL | 2 refills | Status: DC

## 2017-10-11 MED ORDER — EPHEDRINE SULFATE 50 MG/ML IJ SOLN
INTRAMUSCULAR | Status: DC | PRN
  Administered 2017-10-11: 10 mg via INTRAVENOUS

## 2017-10-11 MED ORDER — LUBIPROSTONE 24 MCG PO CAPS: 24.0000 ug | ORAL_CAPSULE | Freq: Two times a day (BID) | ORAL | 0 refills | Status: DC

## 2017-10-11 NOTE — Anesthesia Post-op Follow-up Note (Signed)
Anesthesia QCDR form completed.        

## 2017-10-11 NOTE — Transfer of Care (Signed)
Immediate Anesthesia Transfer of Care Note  Patient: Briana Norton  Procedure(s) Performed: ESOPHAGOGASTRODUODENOSCOPY (EGD) WITH  PROPOFOL AND DILATION (N/A )  Patient Location: PACU and Endoscopy Unit  Anesthesia Type:General  Level of Consciousness: awake, alert , oriented and patient cooperative  Airway & Oxygen Therapy: Patient Spontanous Breathing  Post-op Assessment: Report given to RN, Post -op Vital signs reviewed and stable and Patient moving all extremities  Post vital signs: Reviewed and stable  Last Vitals:  Vitals Value Taken Time  BP 102/59 10/11/2017 10:11 AM  Temp 36.1 C 10/11/2017 10:10 AM  Pulse 59 10/11/2017 10:12 AM  Resp 27 10/11/2017 10:12 AM  SpO2 100 % 10/11/2017 10:12 AM  Vitals shown include unvalidated device data.  Last Pain:  Vitals:   10/11/17 1010  TempSrc: Tympanic  PainSc: 0-No pain      Patients Stated Pain Goal: 0 (10/11/17 0855)  Complications: No apparent anesthesia complications

## 2017-10-11 NOTE — Op Note (Signed)
[facility] Gastroenterology Patient Name: Trenese Page Procedure Date: 10/11/2017 9:45 AM MRN: [id number] Account #: [id number] Date of Birth: February 24, 1986 Admit Type: Outpatient Age: 32 Room: [facility] ENDO ROOM 1 Gender: Female Note Status: Finalized Procedure:            Upper GI endoscopy Indications:          Dysphagia Providers:            Wyline Mood MD, MD Medicines:            Monitored Anesthesia Care Complications:        No immediate complications. Procedure:            Pre-Anesthesia Assessment:                       - Prior to the procedure, a History and Physical was                        performed, and patient medications, allergies and                        sensitivities were reviewed. The patient's tolerance of                        previous anesthesia was reviewed.                       - The risks and benefits of the procedure and the                        sedation options and risks were discussed with the                        patient. All questions were answered and informed                        consent was obtained.                       - ASA Grade Assessment: II - A patient with mild                        systemic disease.                       After obtaining informed consent, the endoscope was                        passed under direct vision. Throughout the procedure,                        the patient's blood pressure, pulse, and oxygen                        saturations were monitored continuously. The Endoscope                        was introduced through the mouth, and advanced to the                        third part of duodenum. The upper GI endoscopy was  accomplished with ease. The patient tolerated the                        procedure well. Findings:      The examined duodenum was normal.      The examined esophagus was normal. Biopsies were taken with a cold       forceps for histology.      One  non-bleeding superficial gastric ulcer with no stigmata of bleeding       was found in the cardia. The lesion was 3 mm in largest dimension.       Biopsies were taken with a cold forceps for histology. The immediate       area surrounding the ulcer was erthematous and that area was also       inclued in the biopsy.      The exam was otherwise without abnormality. Impression:           - Normal examined duodenum.                       - Normal esophagus. Biopsied.                       - Non-bleeding gastric ulcer with no stigmata of                        bleeding. Biopsied.                       - The examination was otherwise normal. Recommendation:       - Discharge patient to home (with escort).                       - Resume previous diet.                       - Continue present medications.                       - Await pathology results.                       - Return to my office as previously scheduled. Procedure Code(s):    --- Professional ---                       [id number], Esophagogastroduodenoscopy, flexible, transoral;                        with biopsy, single or multiple Diagnosis Code(s):    --- Professional ---                       K25.9, Gastric ulcer, unspecified as acute or chronic,                        without hemorrhage or perforation                       R13.10, Dysphagia, unspecified CPT copyright 2017 American Medical Association. All rights reserved. The codes documented in this report are preliminary and upon coder review may  be revised to meet current compliance requirements. Wyline Mood, MD Wyline Mood MD, MD 10/11/2017 10:03:49 AM This report has been  signed electronically. Number of Addenda: 0 Note Initiated On: 10/11/2017 9:45 AM      [facility]

## 2017-10-11 NOTE — Anesthesia Preprocedure Evaluation (Signed)
Anesthesia Evaluation  Patient identified by MRN, date of birth, ID band Patient awake    Reviewed: Allergy & Precautions, H&P , NPO status , Patient's Chart, lab work & pertinent test results, reviewed documented beta blocker date and time   History of Anesthesia Complications (+) AWARENESS UNDER ANESTHESIA and history of anesthetic complications  Airway Mallampati: I  TM Distance: >3 FB Neck ROM: full    Dental  (+) Dental Advidsory Given   Pulmonary neg pulmonary ROS, former smoker,           Cardiovascular Exercise Tolerance: Good negative cardio ROS       Neuro/Psych PSYCHIATRIC DISORDERS Anxiety negative neurological ROS     GI/Hepatic Neg liver ROS, GERD  ,  Endo/Other  negative endocrine ROS  Renal/GU negative Renal ROS  negative genitourinary   Musculoskeletal   Abdominal   Peds  Hematology negative hematology ROS (+)   Anesthesia Other Findings Past Medical History: No date: Endometriosis No date: Hx of menorrhagia     Comment:  ? endometriosis No date: IBS (irritable bowel syndrome)   Reproductive/Obstetrics negative OB ROS                             Anesthesia Physical Anesthesia Plan  ASA: II  Anesthesia Plan: General   Post-op Pain Management:    Induction: Intravenous  PONV Risk Score and Plan: 3 and Propofol infusion  Airway Management Planned: Nasal Cannula  Additional Equipment:   Intra-op Plan:   Post-operative Plan:   Informed Consent: I have reviewed the patients History and Physical, chart, labs and discussed the procedure including the risks, benefits and alternatives for the proposed anesthesia with the patient or authorized representative who has indicated his/her understanding and acceptance.   Dental Advisory Given  Plan Discussed with: Anesthesiologist, CRNA and Surgeon  Anesthesia Plan Comments:         Anesthesia Quick  Evaluation

## 2017-10-11 NOTE — Anesthesia Postprocedure Evaluation (Signed)
Anesthesia Post Note  Patient: Briana Norton Page  Procedure(s) Performed: ESOPHAGOGASTRODUODENOSCOPY (EGD) WITH  PROPOFOL AND DILATION (N/A )  Patient location during evaluation: Endoscopy Anesthesia Type: General Level of consciousness: awake and alert, oriented and patient cooperative Pain management: satisfactory to patient Vital Signs Assessment: post-procedure vital signs reviewed and stable Respiratory status: spontaneous breathing and respiratory function stable Cardiovascular status: stable and blood pressure returned to baseline Postop Assessment: no headache, no backache, patient able to bend at knees, no apparent nausea or vomiting and adequate PO intake Anesthetic complications: no     Last Vitals:  Vitals:   10/11/17 1006 10/11/17 1010  BP: 103/60 (!) 102/59  Pulse: 63   Resp: 14   Temp: (!) 36 C (!) 36.1 C  SpO2: 98%     Last Pain:  Vitals:   10/11/17 1010  TempSrc: Tympanic  PainSc: 0-No pain                 [name] H Kishia Shackett

## 2017-10-11 NOTE — H&P (Signed)
Wyline Mood, MD [address], [address], [place], [place], [address] [address], [place], [place], [address] Phone: [phone]  Fax: [phone]  Primary Care Physician:  Erasmo Downer, NP   Pre-Procedure History & Physical: HPI:  Briana Norton is a 32 y.o. female is here for an endoscopy    Past Medical History:  Diagnosis Date  . Endometriosis   . Hx of menorrhagia    ? endometriosis  . IBS (irritable bowel syndrome)     Past Surgical History:  Procedure Laterality Date  . ESOPHAGOGASTRODUODENOSCOPY  02/08/2012   SLF:Non-erosive gastritis (inflammation) was found in the gastric antrum/ The mucosa of the esophagus appeared normal; multiple biopsies, path: negative h.pylori  . TUBAL LIGATION    . tubal reversal      Prior to Admission medications   Medication Sig Start Date End Date Taking? Authorizing Provider  clonazePAM (KLONOPIN) 0.5 MG tablet Take 0.5 mg by mouth 2 (two) times daily.   Yes [provider]    Allergies as of 10/07/2017 - Review Complete 10/06/2017  Allergen Reaction Noted  . Sulfa antibiotics Rash 01/28/2012    Family History  Problem Relation Age of Onset  . Colon cancer Maternal Grandfather     Social History   Socioeconomic History  . Marital status: Married    Spouse name: Not on file  . Number of children: Not on file  . Years of education: Not on file  . Highest education level: Not on file  Occupational History  . 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 Financial resource strain: Not on file  . Food insecurity:    Worry: Not on file    Inability: Not on file  . Transportation needs:    Medical: Not on file    Non-medical: Not on file  Tobacco Use  . Smoking status: Former Smoker    Types: Cigarettes    Last attempt to quit: 04/02/2013    Years since quitting: 4.5  . Smokeless tobacco: Never Used  Substance and Sexual Activity    . Alcohol use: No    Alcohol/week: 0.0 oz  . Drug use: No  . Sexual activity: Yes  Lifestyle  . Physical activity:    Days per week: Not on file    Minutes per session: Not on file  . Stress: Not on file  Relationships  . Social connections:    Talks on phone: Not on file    Gets together: Not on file    Attends religious service: Not on file    Active member of club or organization: Not on file    Attends meetings of clubs or organizations: Not on file    Relationship status: Not on file  . Intimate partner violence:    Fear of current or ex partner: Not on file    Emotionally abused: Not on file    Physically abused: Not on file    Forced sexual activity: Not on file  Other Topics Concern  . Not on file  Social History Narrative  . Not on file    Review of Systems: See HPI, otherwise negative ROS  Physical Exam: BP 102/69   Pulse (!) 56   Temp (!) 97.4 F (36.3 C) (Tympanic)   Resp 20   Ht  (1.651 m)   LMP 08/21/2017 (Approximate)   SpO2 100%   BMI 22.30 kg/m  General:  Alert,  pleasant and cooperative in NAD Head:  Normocephalic and atraumatic. Neck:  Supple; no masses or thyromegaly. Lungs:  Clear throughout to auscultation, normal respiratory effort.    Heart:  +S1, +S2, Regular rate and rhythm, No edema. Abdomen:  Soft, nontender and nondistended. Normal bowel sounds, without guarding, and without rebound.   Neurologic:  Alert and  oriented x4;  grossly normal neurologically.  Impression/Plan: Briana Norton is here for an endoscopy and possible dilation   to be performed for  evaluation of abdominal pain and dysphagia    Risks, benefits, limitations, and alternatives regarding endoscopy have been reviewed with the patient.  Questions have been answered.  All parties agreeable.   Wyline Mood, MD  10/11/2017, 9:44 AM

## 2017-10-12 LAB — SURGICAL PATHOLOGY

## 2017-10-13 ENCOUNTER — Encounter: Payer: Self-pay | Admitting: Gastroenterology

## 2017-10-16 ENCOUNTER — Encounter: Payer: Self-pay | Admitting: Gastroenterology

## 2017-10-17 ENCOUNTER — Telehealth: Payer: Self-pay | Admitting: Gastroenterology

## 2017-10-17 NOTE — Telephone Encounter (Signed)
The antibiotics were only if her H pylori were to be positive. It is infact negative hence no antibiotics  Dr Wyline Mood MD,MRCP [facility]) Gastroenterology/Hepatology Pager: [phone]

## 2017-10-17 NOTE — Telephone Encounter (Signed)
Pt states she been waiting to hear about her rx she was supposed to start rx for antibiotic  And has not heard anything for 2 weeks  Please call pt she can not keep any  Food in. She states the pharamcy is waiting on response from [place].

## 2017-10-18 ENCOUNTER — Other Ambulatory Visit: Payer: Self-pay

## 2017-10-18 DIAGNOSIS — R197 Diarrhea, unspecified: Secondary | ICD-10-CM

## 2017-10-18 DIAGNOSIS — R109 Unspecified abdominal pain: Secondary | ICD-10-CM

## 2017-10-18 NOTE — Progress Notes (Unsigned)
Received phone call from patient stating severe diarrhea and abdominal pain suggestive of UTI or bladder infection.   Per Dr. Tobi Bastos ordered labs.

## 2017-10-19 LAB — GI PROFILE, STOOL, PCR
Adenovirus F 40/41: NOT DETECTED
Astrovirus: NOT DETECTED
C DIFFICILE TOXIN A/B: NOT DETECTED
CAMPYLOBACTER: NOT DETECTED
Cryptosporidium: NOT DETECTED
Cyclospora cayetanensis: NOT DETECTED
ENTAMOEBA HISTOLYTICA: NOT DETECTED
ENTEROTOXIGENIC E COLI: NOT DETECTED
Enteroaggregative E coli: NOT DETECTED
Enteropathogenic E coli: NOT DETECTED
Giardia lamblia: NOT DETECTED
NOROVIRUS GI/GII: DETECTED — AB
PLESIOMONAS SHIGELLOIDES: NOT DETECTED
Rotavirus A: NOT DETECTED
SHIGELLA/ENTEROINVASIVE E COLI: NOT DETECTED
Salmonella: NOT DETECTED
Sapovirus: NOT DETECTED
Shiga-toxin-producing E coli: NOT DETECTED
Vibrio cholerae: NOT DETECTED
Vibrio: NOT DETECTED
Yersinia enterocolitica: NOT DETECTED

## 2017-10-19 LAB — URINALYSIS, ROUTINE W REFLEX MICROSCOPIC
Bilirubin, UA: NEGATIVE
Glucose, UA: NEGATIVE
Ketones, UA: NEGATIVE
Leukocytes, UA: NEGATIVE
Nitrite, UA: NEGATIVE
PH UA: 5.5 (ref 5.0–7.5)
PROTEIN UA: NEGATIVE
RBC, UA: NEGATIVE
Specific Gravity, UA: 1.009 (ref 1.005–1.030)
UUROB: 0.2 mg/dL (ref 0.2–1.0)

## 2017-10-19 LAB — URINALYSIS, MICROSCOPIC ONLY
Casts: NONE SEEN /lpf
RBC, UA: NONE SEEN /hpf (ref 0–2)

## 2017-10-19 LAB — CLOSTRIDIUM DIFFICILE BY PCR: Toxigenic C. Difficile by PCR: NEGATIVE

## 2017-10-24 ENCOUNTER — Telehealth: Payer: Self-pay

## 2017-10-24 ENCOUNTER — Telehealth: Payer: Self-pay | Admitting: Gastroenterology

## 2017-10-24 NOTE — Telephone Encounter (Signed)
Advised Ms. Page of results per Dr. Tobi Bastos.   Norovirus is highly contagious. Suggest strong hygiene regime: 1st line handwashing.  Ms. Shanon Rosser says the symptoms are starting to calm down.

## 2017-10-24 NOTE — Telephone Encounter (Signed)
Pt is returning caLL

## 2017-10-24 NOTE — Telephone Encounter (Signed)
LVM for patient callback for lab results.    - inform Stool shows norovirus as we discussed and informed her- no medication - lotsof fluids . Diarrhea should resolve in a few days   No UTI  Ask her to see [place] back in a few weeks

## 2017-10-24 NOTE — Telephone Encounter (Signed)
Pt is calling for results please call pt °

## 2018-06-14 IMAGING — US US ABDOMEN COMPLETE
1 series · 14 of 25 positions shown · non-contrast
Comparison: None.

CLINICAL DATA: Epigastric and left upper quadrant pain.

EXAM:
ABDOMEN ULTRASOUND COMPLETE

[Series 1: us abdomen complete · 0.23mm/px · 14 of 142 slices shown]
[im 1/142]
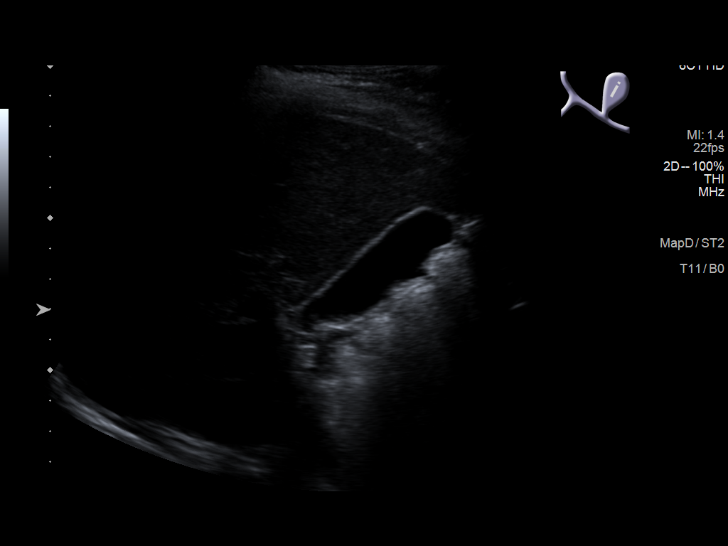
[im 12/142]
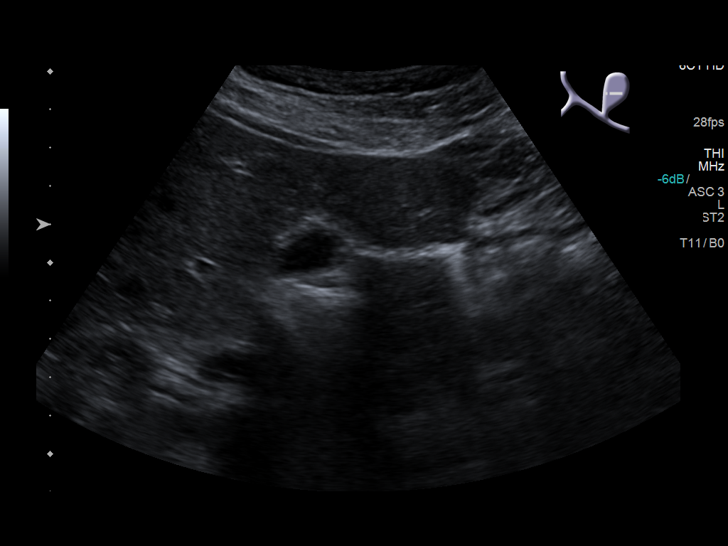
[im 24/142]
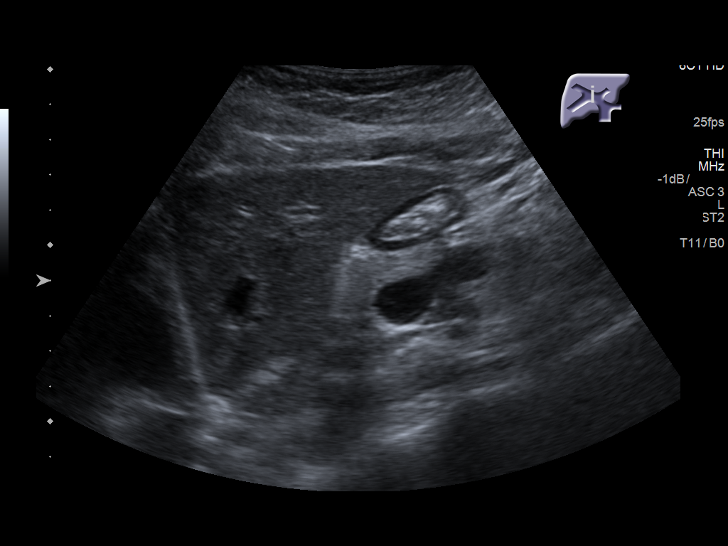
[im 36/142]
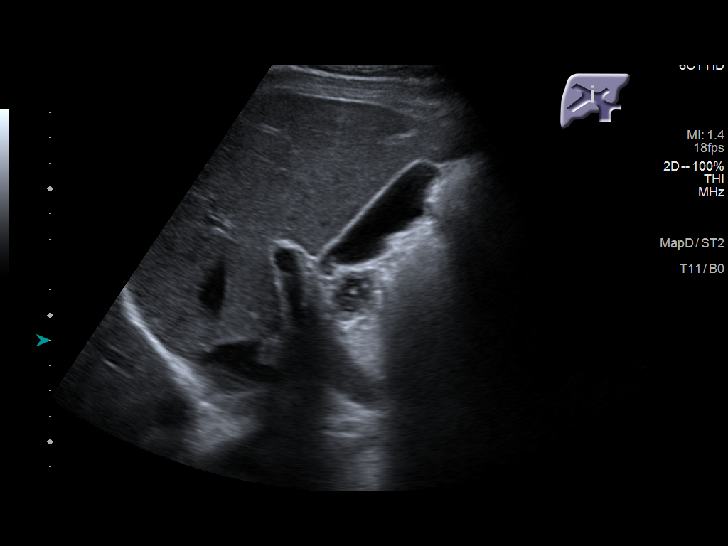
[im 48/142]
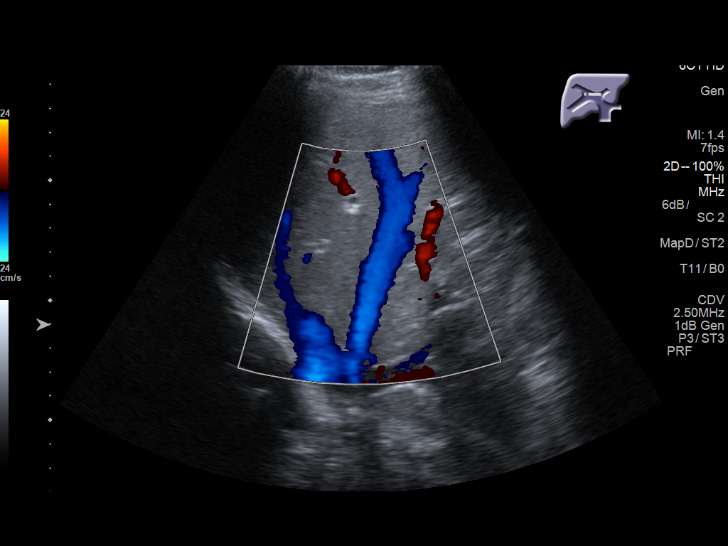
[im 53/142]
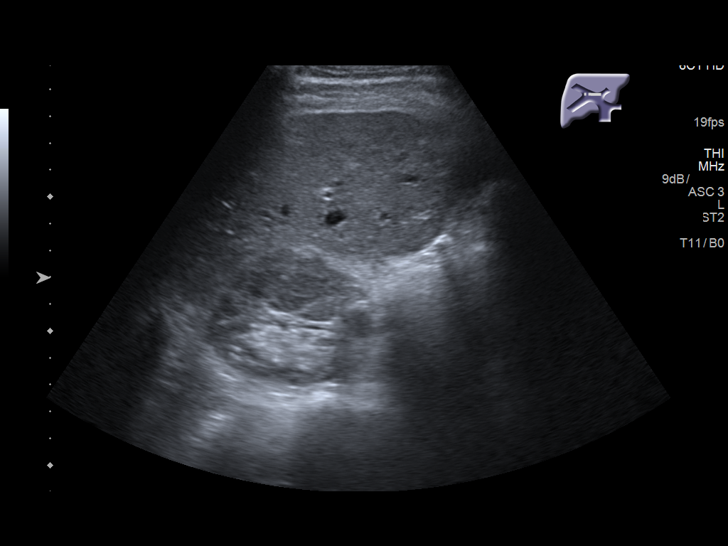
[im 65/142]
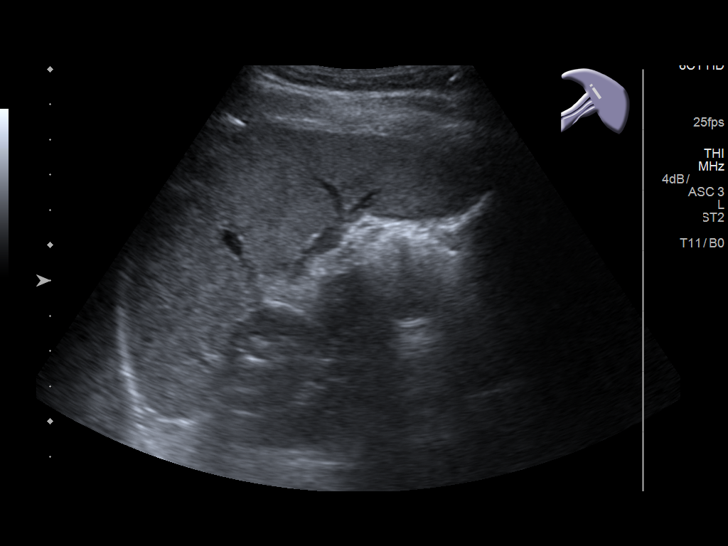
[im 77/142]
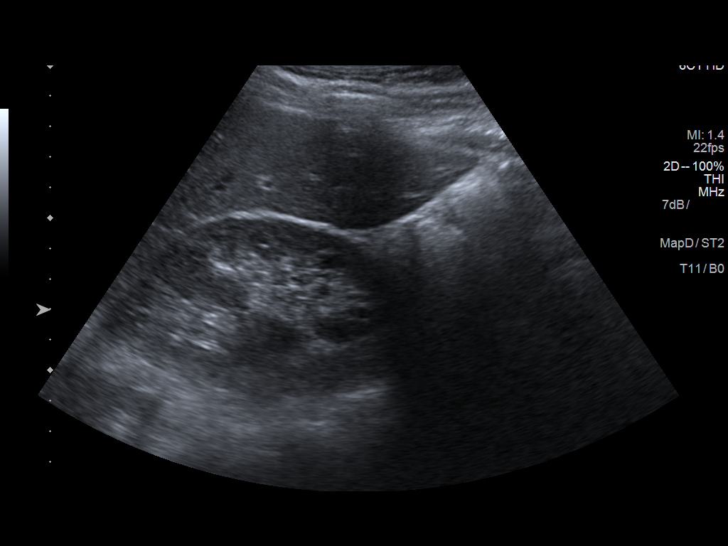
[im 89/142]
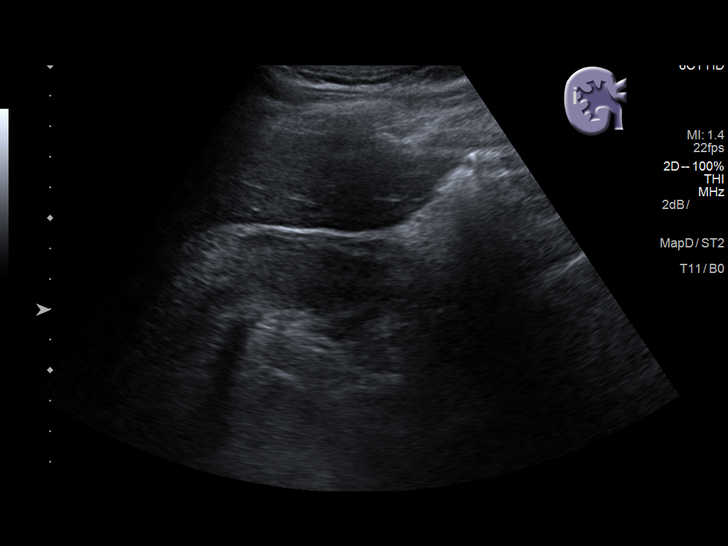
[im 95/142]
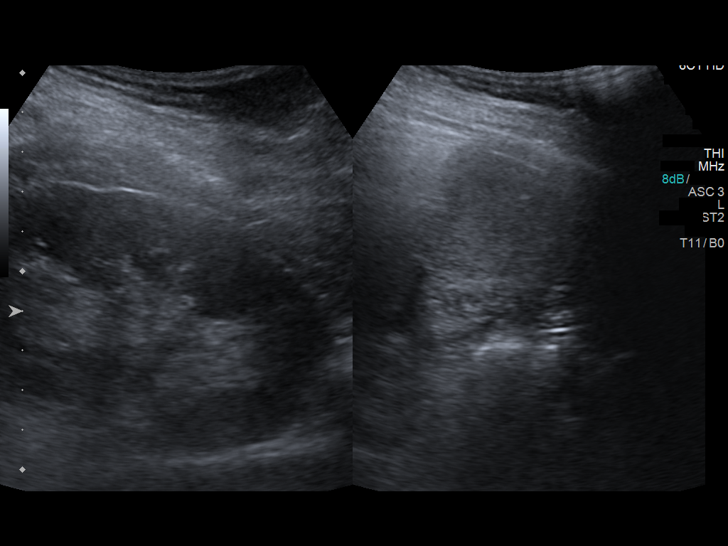
[im 106/142]
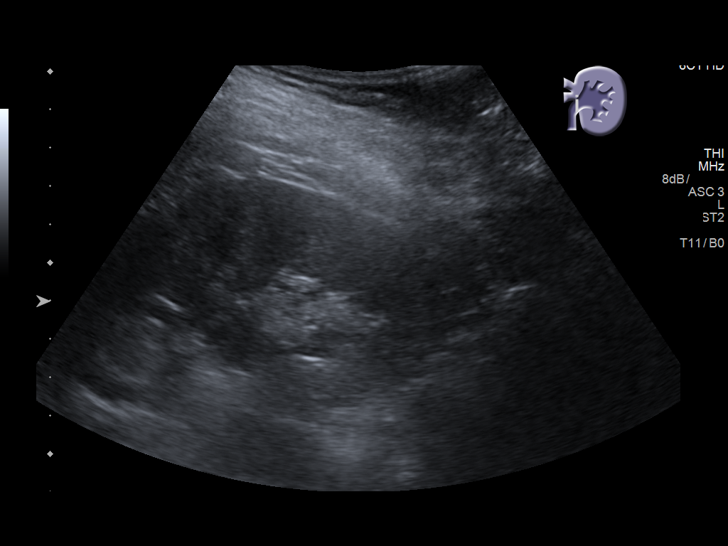
[im 118/142]
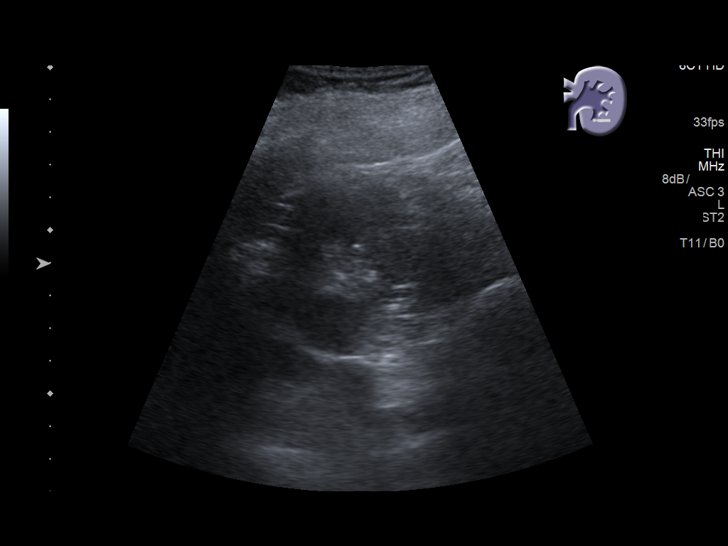
[im 130/142]
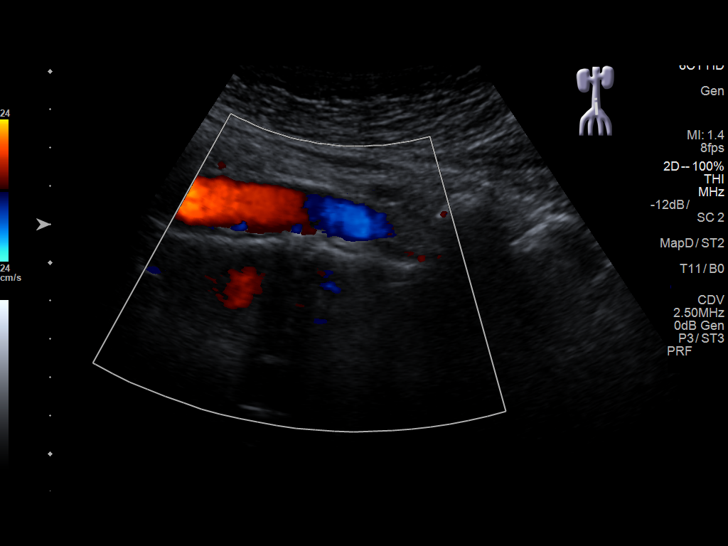
[im 142/142]
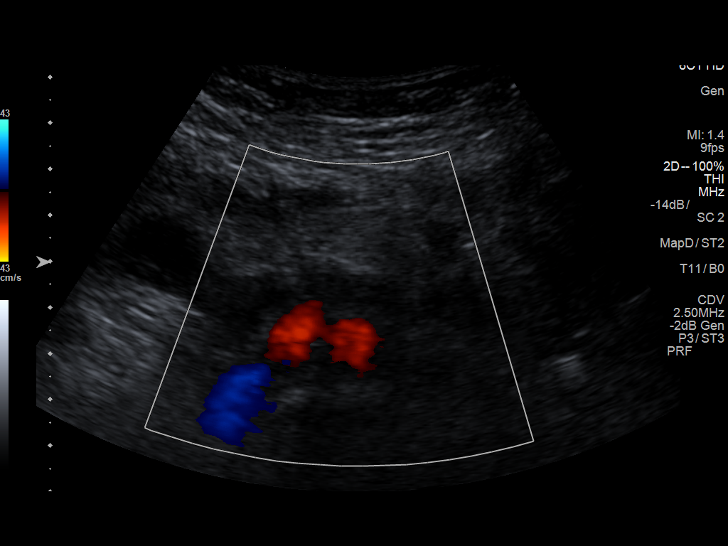

[14 of 25 positions shown; findings below may reference images not displayed]

FINDINGS: Gallbladder: No gallstones or wall thickening visualized. No
sonographic Murphy sign noted by sonographer.

Common bile duct: Diameter: 2.3 mm

Liver: No focal lesion identified. Within normal limits in
parenchymal echogenicity. Portal vein is patent on color Doppler
imaging with normal direction of blood flow towards the liver.

IVC: No abnormality visualized.

Pancreas: Visualized portion unremarkable.

Spleen: Size and appearance within normal limits.

Right Kidney: Length: 10.7 cm. Echogenicity within normal limits. No
mass or hydronephrosis visualized.

Left Kidney: Length: 10.2 cm. Echogenicity within normal limits. No
mass or hydronephrosis visualized.

Abdominal aorta: No aneurysm visualized.

Other findings: None.
IMPRESSION: 1. No cause for the patient's symptoms identified.

## 2018-10-09 ENCOUNTER — Telehealth: Payer: Self-pay | Admitting: Gastroenterology

## 2018-10-09 NOTE — Telephone Encounter (Signed)
PT IS NO LONGER A PT OF [facility]. ESTABLISHED CARE WITH [facility] IN MAY 2016.

## 2018-10-09 NOTE — Progress Notes (Signed)
REVIEWED. ESTABLISHED CARE WITH [facility] MAY 2016.

## 2019-01-29 ENCOUNTER — Other Ambulatory Visit: Payer: Self-pay

## 2019-01-29 DIAGNOSIS — Z20822 Contact with and (suspected) exposure to covid-19: Secondary | ICD-10-CM

## 2019-01-30 ENCOUNTER — Telehealth: Payer: Self-pay | Admitting: General Practice

## 2019-01-30 LAB — NOVEL CORONAVIRUS, NAA: SARS-CoV-2, NAA: NOT DETECTED

## 2019-01-30 NOTE — Telephone Encounter (Signed)
Negative COVID results given. Patient results "NOT Detected." Caller expressed understanding. ° °

## 2019-03-05 ENCOUNTER — Other Ambulatory Visit: Payer: Self-pay | Admitting: Nurse Practitioner

## 2019-03-05 DIAGNOSIS — N921 Excessive and frequent menstruation with irregular cycle: Secondary | ICD-10-CM

## 2019-03-06 ENCOUNTER — Other Ambulatory Visit: Payer: Self-pay

## 2019-03-06 ENCOUNTER — Ambulatory Visit
Admission: RE | Admit: 2019-03-06 | Discharge: 2019-03-06 | Disposition: A | Discharge status: Home / Self Care | Payer: 59 | Source: Ambulatory Visit | Attending: Nurse Practitioner | Admitting: Nurse Practitioner

## 2019-03-06 DIAGNOSIS — N921 Excessive and frequent menstruation with irregular cycle: Secondary | ICD-10-CM

## 2019-04-13 ENCOUNTER — Telehealth: Payer: Self-pay

## 2019-04-13 NOTE — Telephone Encounter (Signed)
Received a referral from [facility].   For heavy menstrual cycles.   Pt vm not set up.Will try later.

## 2019-04-23 ENCOUNTER — Encounter: Payer: Self-pay | Admitting: Certified Nurse Midwife

## 2019-04-23 ENCOUNTER — Ambulatory Visit: Payer: 59 | Admitting: Certified Nurse Midwife

## 2019-04-23 ENCOUNTER — Telehealth: Payer: Self-pay

## 2019-04-23 ENCOUNTER — Other Ambulatory Visit: Payer: Self-pay

## 2019-04-23 VITALS — BP 110/72 | HR 79 | Ht 65.0 in | Wt 136.9 lb

## 2019-04-23 DIAGNOSIS — N921 Excessive and frequent menstruation with irregular cycle: Secondary | ICD-10-CM

## 2019-04-23 DIAGNOSIS — N9089 Other specified noninflammatory disorders of vulva and perineum: Secondary | ICD-10-CM | POA: Diagnosis not present

## 2019-04-23 DIAGNOSIS — N946 Dysmenorrhea, unspecified: Secondary | ICD-10-CM

## 2019-04-23 DIAGNOSIS — R635 Abnormal weight gain: Secondary | ICD-10-CM | POA: Diagnosis not present

## 2019-04-23 NOTE — Patient Instructions (Signed)
Tranexamic acid oral tablets What is this medicine? TRANEXAMIC ACID (TRAN ex AM ik AS id) slows down or stops blood clots from being broken down. This medicine is used to treat heavy monthly menstrual bleeding. This medicine may be used for other purposes; ask your health care provider or pharmacist if you have questions. COMMON BRAND NAME(S): Cyklokapron, Lysteda What should I tell my health care provider before I take this medicine? They need to know if you have any of these conditions:  bleeding in the brain  blood clotting problems  kidney disease  vision problems  an unusual allergic reaction to tranexamic acid, other medicines, foods, dyes, or preservatives  pregnant or trying to get pregnant  breast-feeding How should I use this medicine? Take this medicine by mouth with a glass of water. Follow the directions on the prescription label. Do not cut, crush, or chew this medicine. You can take it with or without food. If it upsets your stomach, take it with food. Take your medicine at regular intervals. Do not take it more often than directed. Do not stop taking except on your doctor's advice. Do not take this medicine until your period has started. Do not take it for more than 5 days in a row. Do not take this medicine when you do not have your period. Talk to your pediatrician regarding the use of this medicine in children. While this drug may be prescribed for female children as young as 12 years of age for selected conditions, precautions do apply. Overdosage: If you think you have taken too much of this medicine contact a poison control center or emergency room at once. NOTE: This medicine is only for you. Do not share this medicine with others. What if I miss a dose? If you miss a dose, take it when you remember, and then take your next dose at least 6 hours later. Do not take more than 2 tablets at a time to make up for missed doses. What may interact with this medicine? Do  not take this medicine with any of the following medications:  estrogens  birth control pills, patches, injections, rings or other devices that contain both an estrogen and a progestin This medicine may also interact with the following medications:  certain medicines used to help your blood clot  tretinoin (taken by mouth) This list may not describe all possible interactions. Give your health care provider a list of all the medicines, herbs, non-prescription drugs, or dietary supplements you use. Also tell them if you smoke, drink alcohol, or use illegal drugs. Some items may interact with your medicine. What should I watch for while using this medicine? Tell your doctor or healthcare professional if your symptoms do not start to get better or if they get worse. Tell your doctor or healthcare professional if you notice any eye problems while taking this medicine. Your doctor will refer you to an eye doctor who will examine your eyes. What side effects may I notice from receiving this medicine? Side effects that you should report to your doctor or health care professional as soon as possible:  allergic reactions like skin rash, itching or hives, swelling of the face, lips, or tongue  breathing difficulties  changes in vision  sudden or severe pain in the chest, legs, head, or groin  unusually weak or tired Side effects that usually do not require medical attention (report to your doctor or health care professional if they continue or are bothersome):  back pain    headache  muscle or joint aches  sinus and nasal problems  stomach pain  tiredness This list may not describe all possible side effects. Call your doctor for medical advice about side effects. You may report side effects to FDA at 1-800-FDA-1088. Where should I keep my medicine? Keep out of the reach of children. Store at room temperature between 15 and 30 degrees C (59 and 86 degrees F). Throw away any unused  medicine after the expiration date. NOTE: This sheet is a summary. It may not cover all possible information. If you have questions about this medicine, talk to your doctor, pharmacist, or health care provider.  2020 Elsevier/Gold Standard (2015-07-03 09:12:15) Dysmenorrhea Dysmenorrhea means painful cramps during your period (menstrual period). You will have pain in your lower belly (abdomen). The pain is caused by the tightening (contracting) of the muscles of the womb (uterus). The pain may be mild or very bad. With this condition, you may:  Have a headache.  Feel sick to your stomach (nauseous).  Throw up (vomit).  Have lower back pain. Follow these instructions at home: Helping pain and cramping   Put heat on your lower back or belly when you have pain or cramps. Use the heat source that your doctor tells you to use. ? Place a towel between your skin and the heat. ? Leave the heat on for 20-30 minutes. ? Remove the heat if your skin turns bright red. This is especially important if you cannot feel pain, heat, or cold. ? Do not have a heating pad on during sleep.  Do aerobic exercises. These include walking, swimming, or biking. These may help with cramps.  Massage your lower back or belly. This may help lessen pain. General instructions  Take over-the-counter and prescription medicines only as told by your doctor.  Do not drive or use heavy machinery while taking prescription pain medicine.  Avoid alcohol and caffeine during and right before your period. These can make cramps worse.  Do not use any products that have nicotine or tobacco. These include cigarettes and e-cigarettes. If you need help quitting, ask your doctor.  Keep all follow-up visits as told by your doctor. This is important. Contact a doctor if:  You have pain that gets worse.  You have pain that does not get better with medicine.  You have pain during sex.  You feel sick to your stomach or you  throw up during your period, and medicine does not help. Get help right away if:  You pass out (faint). Summary  Dysmenorrhea means painful cramps during your period (menstrual period).  Put heat on your lower back or belly when you have pain or cramps.  Do exercises like walking, swimming, or biking to help with cramps.  Contact a doctor if you have pain during sex. This information is not intended to replace advice given to you by your health care provider. Make sure you discuss any questions you have with your health care provider. Document Released: 08/27/2008 Document Revised: 05/13/2017 Document Reviewed: 06/17/2016 Elsevier Patient Education  2020 [place]. Menorrhagia Menorrhagia is when your menstrual periods are heavy or last longer than normal. Follow these instructions at home: Medicines   Take over-the-counter and prescription medicines exactly as told by your doctor. This includes iron pills.  Do not change or switch medicines without asking your doctor.  Do not take aspirin or medicines that contain aspirin 1 week before or during your period. Aspirin may make bleeding worse. General instructions  If you need to change your pad or tampon more than once every 2 hours, limit your activity until the bleeding stops.  Iron pills can cause problems when pooping (constipation). To prevent or treat pooping problems while taking prescription iron pills, your doctor may suggest that you: ? Drink enough fluid to keep your pee (urine) clear or pale yellow. ? Take over-the-counter or prescription medicines. ? Eat foods that are high in fiber. These foods include: ? Fresh fruits and vegetables. ? Whole grains. ? Beans. ? Limit foods that are high in fat and processed sugars. This includes fried and sweet foods.  Eat healthy meals and foods that are high in iron. Foods that have a lot of iron include: ? Leafy green vegetables. ? Meat. ? Liver. ? Eggs. ? Whole grain  breads and cereals.  Do not try to lose weight until your heavy bleeding has stopped and you have normal amounts of iron in your blood. If you need to lose weight, work with your doctor.  Keep all follow-up visits as told by your doctor. This is important. Contact a doctor if:  You soak through a pad or tampon every 1 or 2 hours, and this happens every time you have a period.  You need to use pads and tampons at the same time because you are bleeding so much.  You are taking medicine and you: ? Feel sick to your stomach (nauseous). ? Throw up (vomit). ? Have watery poop (diarrhea).  You have other problems that may be related to the medicine you are taking. Get help right away if:  You soak through more than a pad or tampon in 1 hour.  You pass clots bigger than 1 inch (2.5 cm) wide.  You feel short of breath.  You feel like your heart is beating too fast.  You feel dizzy or you pass out (faint).  You feel very weak or tired. Summary  Menorrhagia is when your menstrual periods are heavy or last longer than normal.  Take over-the-counter and prescription medicines exactly as told by your doctor. This includes iron pills.  Contact a doctor if you soak through more than a pad or tampon in 1 hour or are passing large clots. This information is not intended to replace advice given to you by your health care provider. Make sure you discuss any questions you have with your health care provider. Document Released: 03/09/2008 Document Revised: 09/07/2017 Document Reviewed: 06/21/2016 Elsevier Patient Education  2020 [employer].

## 2019-04-23 NOTE — Progress Notes (Signed)
GYN ENCOUNTER NOTE  Subjective:       Briana Norton is a 33 y.o. [id number] female is here for gynecologic evaluation of the following issues:   1. Irregular and painful menstrual periods; no relief with home treatment measures.   Long history of symptoms with various failed treatment attempts (pills, patches, vaginal ring, Mirena). History of tubal ligation and reversal. Reports "scar tissue" from previous surgeries. Seen [facility] ER for pain on 03/06/2019    2. Request skin tag removal  Denies difficulty breathing or respiratory distress, chest pain, abdominal pain, excessive vaginal bleeding, dysuria, and leg pain or swelling.   Gynecologic History  Patient's last menstrual period was 04/09/2019 (exact date). Period Duration (Days): 7 Period Pattern: (!) Irregular Menstrual Flow: Heavy Menstrual Control: Tampon Dysmenorrhea: (!) Severe Dysmenorrhea Symptoms: Cramping  Contraception: none  Last Pap: due.   Obstetric History  OB History  Gravida Para Term Preterm AB Living  3 3 3  0 0 3  SAB TAB Ectopic Multiple Live Births  0 0 0 0 3    # Outcome Date GA Lbr Len/2nd Weight Sex Delivery Anes PTL Lv  3 Term 06/12/10   6 lb (2.722 kg) F Vag-Spont EPI N LIV     Complications: Cranial anomaly  2 Term 11/13/05   7 lb 13 oz (3.544 kg) M Vag-Spont EPI N LIV  1 Term 03/20/04   6 lb (2.722 kg) M Vag-Spont EPI N LIV    Past Medical History:  Diagnosis Date  . Endometriosis   . Hx of menorrhagia    ? endometriosis  . IBS (irritable bowel syndrome)     Past Surgical History:  Procedure Laterality Date  . ESOPHAGOGASTRODUODENOSCOPY  02/08/2012   SLF:Non-erosive gastritis (inflammation) was found in the gastric antrum/ The mucosa of the esophagus appeared normal; multiple biopsies, path: negative h.pylori  . ESOPHAGOGASTRODUODENOSCOPY (EGD) WITH PROPOFOL N/A 10/11/2017   Procedure: ESOPHAGOGASTRODUODENOSCOPY (EGD) WITH  PROPOFOL AND DILATION;  Surgeon: Jonathon Bellows, MD;   Location: [facility] ENDOSCOPY;  Service: Gastroenterology;  Laterality: N/A;  . TUBAL LIGATION    . tubal reversal      Current Outpatient Medications on File Prior to Visit  Medication Sig Dispense Refill  . clonazePAM (KLONOPIN) 0.5 MG tablet Take 0.5 mg by mouth 2 (two) times daily.     No current facility-administered medications on file prior to visit.     Allergies  Allergen Reactions  . Sulfa Antibiotics Rash    Social History   Socioeconomic History  . Marital status: Legally Separated    Spouse name: Not on file  . Number of children: Not on file  . Years of education: Not on file  . Highest education level: Not on file  Occupational History  . 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 Financial resource strain: Not on file  . Food insecurity    Worry: Not on file    Inability: Not on file  . Transportation needs    Medical: Not on file    Non-medical: Not on file  Tobacco Use  . Smoking status: Current Every Day Smoker    Packs/day: 1.00    Types: Cigarettes  . Smokeless tobacco: Never Used  Substance and Sexual Activity  . Alcohol use: Yes    Alcohol/week: 0.0 standard drinks    Comment: occasional   . Drug use: No  . Sexual activity: Yes    Birth control/protection: None  Lifestyle  .  Physical activity    Days per week: Not on file    Minutes per session: Not on file  . Stress: Not on file  Relationships  . Social Musician on phone: Not on file    Gets together: Not on file    Attends religious service: Not on file    Active member of club or organization: Not on file    Attends meetings of clubs or organizations: Not on file    Relationship status: Not on file  . Intimate partner violence    Fear of current or ex partner: Not on file    Emotionally abused: Not on file    Physically abused: Not on file    Forced sexual activity: Not on file  Other Topics Concern  . Not on file   Social History Narrative  . Not on file    Family History  Problem Relation Age of Onset  . Colon cancer Maternal Grandfather   . Breast cancer Paternal Grandmother   . Ovarian cancer Neg Hx     The following portions of the patient's history were reviewed and updated as appropriate: allergies, current medications, past family history, past medical history, past social history, past surgical history and problem list.  Review of Systems  ROS negative except as noted above. Information obtained from patient.   Objective:   BP 110/72   Pulse 79   Ht 5\' 5"  (1.651 m)   Wt 136 lb 14.4 oz (62.1 kg)   LMP 04/09/2019 (Exact Date)   BMI 22.78 kg/m   CONSTITUTIONAL: Well-developed, well-nourished female in no acute distress.   PHYSICAL EXAM: Not indicated.    Assessment:   1. Menorrhagia with irregular cycle  - CBC - Estradiol - Ferritin - FSH/LH - TSH - HCG, Tumor Marker-new beta - 04/11/2019 GYN Pelvis Complete; Future - [place] GYN Transvaginal; Future  2. Dysmenorrhea  - CBC - Estradiol - Ferritin - FSH/LH - TSH - HCG, Tumor Marker-new beta - [place] GYN Pelvis Complete; Future - [place] GYN Transvaginal; Future  3. Weight gain  - TSH - HCG, Tumor Marker-new beta - [place] G[facility] Pelvis Complete; Future - [place] GYN Transvaginal; Future     Plan:   Labs today, see orders.   Discussed differential diagnoses and possible treatment options, handouts provided.   Referral to General Surgery, see orders.   Reviewed red flag symptoms and when to call.   RTC for ultrasound and results review.    [place], CNM [facility]   A total of 20 minutes were spent face-to-face with the patient during this encounter and over half of that time dealt with counseling and coordination of care.

## 2019-04-23 NOTE — Telephone Encounter (Signed)
Provider req TV ultrasound and fu with [facility]. Please advise if an appt should be double booked with [facility] this week or who provider recommends pt to fu with and time frame.

## 2019-04-23 NOTE — Progress Notes (Signed)
Patient here to discuss irregular and painful menstrual periods x1.5 year.  Patient also c/o painful vaginal skin tag x10 years that has increased in size, request removal.

## 2019-04-24 LAB — CBC
Hematocrit: 44 % (ref 34.0–46.6)
Hemoglobin: 15.7 g/dL (ref 11.1–15.9)
MCH: 34.7 pg — ABNORMAL HIGH (ref 26.6–33.0)
MCHC: 35.7 g/dL (ref 31.5–35.7)
MCV: 97 fL (ref 79–97)
Platelets: 220 10*3/uL (ref 150–450)
RBC: 4.53 x10E6/uL (ref 3.77–5.28)
RDW: 11.7 % (ref 11.7–15.4)
WBC: 6.9 10*3/uL (ref 3.4–10.8)

## 2019-04-24 LAB — FSH/LH
FSH: 5 m[IU]/mL
LH: 15.9 m[IU]/mL

## 2019-04-24 LAB — FERRITIN: Ferritin: 61 ng/mL (ref 15–150)

## 2019-04-24 LAB — ESTRADIOL: Estradiol: 148 pg/mL

## 2019-04-24 LAB — TSH: TSH: 1.58 u[IU]/mL (ref 0.450–4.500)

## 2019-04-24 LAB — BETA HCG QUANT (REF LAB): hCG Quant: <1 m[IU]/mL

## 2019-04-25 ENCOUNTER — Ambulatory Visit (INDEPENDENT_AMBULATORY_CARE_PROVIDER_SITE_OTHER): Payer: 59

## 2019-04-25 ENCOUNTER — Other Ambulatory Visit: Payer: Self-pay

## 2019-04-25 DIAGNOSIS — R635 Abnormal weight gain: Secondary | ICD-10-CM | POA: Diagnosis not present

## 2019-04-25 DIAGNOSIS — N946 Dysmenorrhea, unspecified: Secondary | ICD-10-CM

## 2019-04-25 DIAGNOSIS — N921 Excessive and frequent menstruation with irregular cycle: Secondary | ICD-10-CM | POA: Diagnosis not present

## 2019-04-27 ENCOUNTER — Encounter: Payer: Self-pay | Admitting: Certified Nurse Midwife

## 2019-04-27 ENCOUNTER — Other Ambulatory Visit: Payer: Self-pay

## 2019-04-27 ENCOUNTER — Ambulatory Visit: Payer: 59 | Admitting: Certified Nurse Midwife

## 2019-04-27 VITALS — BP 111/75 | HR 75 | Ht 65.0 in | Wt 135.9 lb

## 2019-04-27 DIAGNOSIS — N946 Dysmenorrhea, unspecified: Secondary | ICD-10-CM | POA: Diagnosis not present

## 2019-04-27 DIAGNOSIS — N83209 Unspecified ovarian cyst, unspecified side: Secondary | ICD-10-CM | POA: Diagnosis not present

## 2019-04-27 DIAGNOSIS — N921 Excessive and frequent menstruation with irregular cycle: Secondary | ICD-10-CM | POA: Diagnosis not present

## 2019-04-27 NOTE — Progress Notes (Signed)
GYN ENCOUNTER NOTE  Subjective:       Briana Norton is a 33 y.o. [id number] female here for ultrasound and lab results review.   Seen on 04/23/19 for evaluation of pelvic pain, dysmenorrhea, and menorrhagia with irregular cycle.  No change in symptoms since last visit. Denies difficulty breathing or respiratory distress, chest pain, dysuria, and leg pain or swelling.   History significant for previous bilateral tubal ligation followed by reversal.   Reports failing trials of pills, patch, vaginal ring, and Mirena IUD for treatment of symptoms.    Gynecologic History  Patient's last menstrual period was 04/09/2019 (exact date). Period Duration (Days): 7 Period Pattern: (!) Irregular Menstrual Flow: Heavy Menstrual Control: Tampon Dysmenorrhea: (!) Severe Dysmenorrhea Symptoms: Cramping  Contraception: none  Last Pap: due.   Obstetric History  OB History  Gravida Para Term Preterm AB Living  0 0 3  SAB TAB Ectopic Multiple Live Births  0 0 0 0 3    # Outcome Date GA Lbr Len/2nd Weight Sex Delivery Anes PTL Lv  3 Term 06/12/10   6 lb (2.722 kg) F Vag-Spont EPI N LIV     Complications: Cranial anomaly  2 Term 11/13/05   7 lb 13 oz (3.544 kg) M Vag-Spont EPI N LIV  1 Term 03/20/04   6 lb (2.722 kg) M Vag-Spont EPI N LIV    Past Medical History:  Diagnosis Date  . Endometriosis   . Hx of menorrhagia    ? endometriosis  . IBS (irritable bowel syndrome)     Past Surgical History:  Procedure Laterality Date  . ESOPHAGOGASTRODUODENOSCOPY  02/08/2012   SLF:Non-erosive gastritis (inflammation) was found in the gastric antrum/ The mucosa of the esophagus appeared normal; multiple biopsies, path: negative h.pylori  . ESOPHAGOGASTRODUODENOSCOPY (EGD) WITH PROPOFOL N/A 10/11/2017   Procedure: ESOPHAGOGASTRODUODENOSCOPY (EGD) WITH  PROPOFOL AND DILATION;  Surgeon: Wyline Mood, MD;  Location: [facility] ENDOSCOPY;  Service: Gastroenterology;  Laterality: N/A;  . TUBAL  LIGATION    . tubal reversal      Current Outpatient Medications on File Prior to Visit  Medication Sig Dispense Refill  . clonazePAM (KLONOPIN) 0.5 MG tablet Take 0.5 mg by mouth 2 (two) times daily.     No current facility-administered medications on file prior to visit.     Allergies  Allergen Reactions  . Sulfa Antibiotics Rash    Social History   Socioeconomic History  . Marital status: Legally Separated    Spouse name: Not on file  . Number of children: Not on file  . Years of education: Not on file  . Highest education level: Not on file  Occupational History  . 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 Financial resource strain: Not on file  . Food insecurity    Worry: Not on file    Inability: Not on file  . Transportation needs    Medical: Not on file    Non-medical: Not on file  Tobacco Use  . Smoking status: Current Every Day Smoker    Packs/day: 1.00    Types: Cigarettes  . Smokeless tobacco: Never Used  Substance and Sexual Activity  . Alcohol use: Yes    Alcohol/week: 0.0 standard drinks    Comment: occasional   . Drug use: No  . Sexual activity: Yes    Birth control/protection: None  Lifestyle  . Physical activity    Days per week: Not  on file    Minutes per session: Not on file  . Stress: Not on file  Relationships  . Social Musician on phone: Not on file    Gets together: Not on file    Attends religious service: Not on file    Active member of club or organization: Not on file    Attends meetings of clubs or organizations: Not on file    Relationship status: Not on file  . Intimate partner violence    Fear of current or ex partner: Not on file    Emotionally abused: Not on file    Physically abused: Not on file    Forced sexual activity: Not on file  Other Topics Concern  . Not on file  Social History Narrative  . Not on file    Family History  Problem Relation  Age of Onset  . Colon cancer Maternal Grandfather   . Breast cancer Paternal Grandmother   . Ovarian cancer Neg Hx     The following portions of the patient's history were reviewed and updated as appropriate: allergies, current medications, past family history, past medical history, past social history, past surgical history and problem list.  Review of Systems  ROS negative except as noted above. Information obtained from patient.   Objective:   BP 111/75   Pulse 75   Ht 5\' 5"  (1.651 m)   Wt 135 lb 14.4 oz (61.6 kg)   LMP 04/09/2019 (Exact Date)   BMI 22.61 kg/m   CONSTITUTIONAL: Well-developed, well-nourished female in no acute distress.   PHYSICAL EXAM: Not indicated.   Recent Results (from the past 2160 hour(s))  CBC     Status: Abnormal   Collection Time: 04/23/19  3:41 PM  Result Value Ref Range   WBC 6.9 3.4 - 10.8 x10E3/uL   RBC 4.53 3.77 - 5.28 x10E6/uL   Hemoglobin 15.7 11.1 - 15.9 g/dL   Hematocrit 13/09/20 48.2 - 46.6 %   MCV 97 79 - 97 fL   MCH 34.7 (H) 26.6 - 33.0 pg   MCHC 35.7 31.5 - 35.7 g/dL   RDW 70.7 86.7 - 54.4 %   Platelets 220 150 - 450 x10E3/uL  Estradiol     Status: None   Collection Time: 04/23/19  3:41 PM  Result Value Ref Range   Estradiol 148.0 pg/mL    Comment:                     Adult Female:                       Follicular phase   12.5 -   166.0                       Ovulation phase    85.8 -   498.0                       Luteal phase       43.8 -   211.0                       Postmenopausal     <6.0 -    54.7                     Pregnancy  1st trimester     215.0 - >4300.0 Roche ECLIA methodology   Ferritin     Status: None   Collection Time: 04/23/19  3:41 PM  Result Value Ref Range   Ferritin 61 15 - 150 ng/mL  FSH/LH     Status: None   Collection Time: 04/23/19  3:41 PM  Result Value Ref Range   LH 15.9 mIU/mL    Comment:                     Adult Female:                       Follicular phase      2.4  -  12.6                       Ovulation phase      14.0 -  95.6                       Luteal phase          1.0 -  11.4                       Postmenopausal        7.7 -  58.5    FSH 5.0 mIU/mL    Comment:                     Adult Female:                       Follicular phase      3.5 -  12.5                       Ovulation phase       4.7 -  21.5                       Luteal phase          1.7 -   7.7                       Postmenopausal       25.8 - 134.8   TSH     Status: None   Collection Time: 04/23/19  3:41 PM  Result Value Ref Range   TSH 1.580 0.450 - 4.500 uIU/mL  HCG, Tumor Marker-new beta     Status: None   Collection Time: 04/23/19  3:41 PM  Result Value Ref Range   hCG Quant <1 mIU/mL    Comment:                      Female (Non-pregnant)    0 -     5                             (Postmenopausal)  0 -     8                      Female (Pregnant)                      Weeks of Gestation  3                6 -    71                              4               10 -   750                              5              217 -  7138                              6              158 - 31795                              7             3697 -163563                              8            32065 -149571                              9            63803 -151410                             10            46509 -186977                             12            27832 -210612                             14            13950 - 62530                             15            12039 - 70971                             16             9040 - 56451                             17             8175 - 55868                             18             8099 - 58176 Roche E CLIA methodology  ULTRASOUND REPORT  Location: [facility]  Date of Service: 04/25/2019   TECHNIQUE: Both transabdominal and transvaginal ultrasound examinations of the pelvis were  performed. Transabdominal technique was performed for global imaging of the pelvis including uterus, ovaries, adnexal regions, and pelvic cul-de-sac. It was necessary to proceed with endovaginal exam following the transabdominal exam to visualize the endometrium and adnexa.  Indications:Pelvic Pain Findings:  The uterus is anteverted and measures 9.1 x 4.4 x 6.0 cm. Echo texture is homogenous without evidence of focal masses.  The Endometrium measures 5 mm.  Right Ovary measures 3.5 x 2.4 x 2.6 cm. It is normal in appearance.Hypoechoic lesion smooth wall good through transmission measuring 1.7 x 1.4 x 1.4 cm Left Ovary measures 3.1 x 3.5x 1.8 cm. It is normal in appearance. Survey of the adnexa demonstrates no adnexal masses. There is no free fluid in the cul de sac.  Impression: 1. Rt simple cyst as described above.  Recommendations: 1.Clinical correlation with the patient's History and Physical Exam.      Assessment:   1. Menorrhagia with irregular cycle   2. Dysmenorrhea   3. Simple ovarian cyst      Plan:   Results reviewed with patient, verbalized understanding.   Discussed treatment options including medications, ablation, sonohysterogram, hysterectomy, and/or referral to the [facility].   Patient uncertain about desired treatment at this time.   Advised patient to contact CNM or office after making decision.   RTC for Pap smear or sooner if needed.    Diona Fanti, CNM [facility], [facility] 04/27/19 3:36 PM

## 2019-04-27 NOTE — Patient Instructions (Addendum)
Mittelschmerz Mittelschmerz is pain in the belly (abdomen) that affects women. The pain happens between periods. The pain:  Affects one side of the belly. The side may change from month to month.  May be mild or very bad.  May last from minutes to hours. It does not last longer than 1-2 days.  Can happen with a feeling of being sick to your stomach (nausea).  Can happen with a small amount of bleeding from your vagina. Mittelschmerz is caused by the growth and release of an egg from an ovary. It is a natural part of the ovulation cycle. It often happens about 2 weeks after a woman's period ends. Follow these instructions at home: Watch for any changes in your condition. Let your doctor know about them. Take these actions to help with your pain:  Try soaking in a hot bath.  Try a heating pad to relax the muscles in the belly.  Take over-the-counter and prescription medicines only as told by your doctor.  Keep all follow-up visits as told by your doctor. This is important. Contact a doctor if:  The pain is very bad most months.  The pain lasts longer than 24 hours.  Your pain medicine is not helping.  You have a fever.  You feel sick to your stomach, and the feeling does not go away.  You keep throwing up (vomiting).  You miss your period.  You have more than a small amount of blood coming from your vagina between periods. Summary  Mittelschmerz is a condition that affects women. It is pain in the belly that happens between periods.  This condition is caused by the growth and release of an egg from an ovary.  The pain may affect one side of the belly, may be mild or very bad, or may cause you to feel sick to your stomach.  To help with your pain, soak in a hot bath, use a heating pad, or take medicines.  Watch for any changes in your condition. Know when to contact a doctor for help. This information is not intended to replace advice given to you by your health care  provider. Make sure you discuss any questions you have with your health care provider. Document Released: 07/08/2004 Document Revised: 12/13/2017 Document Reviewed: 12/13/2017 Elsevier Patient Education  2020 [employer] Dysmenorrhea Dysmenorrhea means painful cramps during your period (menstrual period). You will have pain in your lower belly (abdomen). The pain is caused by the tightening (contracting) of the muscles of the womb (uterus). The pain may be mild or very bad. With this condition, you may:  Have a headache.  Feel sick to your stomach (nauseous).  Throw up (vomit).  Have lower back pain. Follow these instructions at home: Helping pain and cramping   Put heat on your lower back or belly when you have pain or cramps. Use the heat source that your doctor tells you to use. ? Place a towel between your skin and the heat. ? Leave the heat on for 20-30 minutes. ? Remove the heat if your skin turns bright red. This is especially important if you cannot feel pain, heat, or cold. ? Do not have a heating pad on during sleep.  Do aerobic exercises. These include walking, swimming, or biking. These may help with cramps.  Massage your lower back or belly. This may help lessen pain. General instructions  Take over-the-counter and prescription medicines only as told by your doctor.  Do not drive or use heavy machinery  while taking prescription pain medicine.  Avoid alcohol and caffeine during and right before your period. These can make cramps worse.  Do not use any products that have nicotine or tobacco. These include cigarettes and e-cigarettes. If you need help quitting, ask your doctor.  Keep all follow-up visits as told by your doctor. This is important. Contact a doctor if:  You have pain that gets worse.  You have pain that does not get better with medicine.  You have pain during sex.  You feel sick to your stomach or you throw up during your period, and medicine  does not help. Get help right away if:  You pass out (faint). Summary  Dysmenorrhea means painful cramps during your period (menstrual period).  Put heat on your lower back or belly when you have pain or cramps.  Do exercises like walking, swimming, or biking to help with cramps.  Contact a doctor if you have pain during sex. This information is not intended to replace advice given to you by your health care provider. Make sure you discuss any questions you have with your health care provider. Document Released: 08/27/2008 Document Revised: 05/13/2017 Document Reviewed: 06/17/2016 Elsevier Patient Education  2020 [employer] Inc. Menorrhagia Menorrhagia is when your menstrual periods are heavy or last longer than normal. Follow these instructions at home: Medicines   Take over-the-counter and prescription medicines exactly as told by your doctor. This includes iron pills.  Do not change or switch medicines without asking your doctor.  Do not take aspirin or medicines that contain aspirin 1 week before or during your period. Aspirin may make bleeding worse. General instructions  If you need to change your pad or tampon more than once every 2 hours, limit your activity until the bleeding stops.  Iron pills can cause problems when pooping (constipation). To prevent or treat pooping problems while taking prescription iron pills, your doctor may suggest that you: ? Drink enough fluid to keep your pee (urine) clear or pale yellow. ? Take over-the-counter or prescription medicines. ? Eat foods that are high in fiber. These foods include: ? Fresh fruits and vegetables. ? Whole grains. ? Beans. ? Limit foods that are high in fat and processed sugars. This includes fried and sweet foods.  Eat healthy meals and foods that are high in iron. Foods that have a lot of iron include: ? Leafy green vegetables. ? Meat. ? Liver. ? Eggs. ? Whole grain breads and cereals.  Do not try to lose  weight until your heavy bleeding has stopped and you have normal amounts of iron in your blood. If you need to lose weight, work with your doctor.  Keep all follow-up visits as told by your doctor. This is important. Contact a doctor if:  You soak through a pad or tampon every 1 or 2 hours, and this happens every time you have a period.  You need to use pads and tampons at the same time because you are bleeding so much.  You are taking medicine and you: ? Feel sick to your stomach (nauseous). ? Throw up (vomit). ? Have watery poop (diarrhea).  You have other problems that may be related to the medicine you are taking. Get help right away if:  You soak through more than a pad or tampon in 1 hour.  You pass clots bigger than 1 inch (2.5 cm) wide.  You feel short of breath.  You feel like your heart is beating too fast.  You feel dizzy  or you pass out (faint).  You feel very weak or tired. Summary  Menorrhagia is when your menstrual periods are heavy or last longer than normal.  Take over-the-counter and prescription medicines exactly as told by your doctor. This includes iron pills.  Contact a doctor if you soak through more than a pad or tampon in 1 hour or are passing large clots. This information is not intended to replace advice given to you by your health care provider. Make sure you discuss any questions you have with your health care provider. Document Released: 03/09/2008 Document Revised: 09/07/2017 Document Reviewed: 06/21/2016 Elsevier Patient Education  2020 [place].  Tubal Ligation Reversal  Tubal ligation is a procedure to close the fallopian tubes so a woman cannot get pregnant. Tubal ligation reversal is a procedure to undo a tubal ligation so that the woman can get pregnant again. During this procedure, the fallopian tubes are reopened, untied, or reconnected to separated sections. The ability to get pregnant again after having this procedure depends on:   Your age.  The type of tubal ligation that you had.  The length of your remaining fallopian tubes.  How healthy the tubes are.  The amount of scar tissue in your pelvic area.  Your partner's fertility. Tell a health care provider about:  Any allergies you have.  All medicines you are taking, including vitamins, herbs, eye drops, creams, and over-the-counter medicines.  Any problems you or family members have had with anesthetic medicines.  Any blood disorders you have.  Any surgeries you have had.  Any medical conditions you have.  Any past pregnancies. What are the risks? Generally, this is a safe procedure. However, problems may occur, including:  Failure to reverse the original procedure.  Future blockage of a reopened or reconnected fallopian tube.  Inability to get pregnant.  Ectopic pregnancy. This is a pregnancy in which a fertilized egg does not attach inside the uterus.  Infection.  Bleeding.  Damage to other structures or organs.  Blood clots in the legs and chest.  Allergic reactions to medicines. What happens before the procedure? Staying hydrated Follow instructions from your health care provider about hydration, which may include:  Up to 2 hours before the procedure - you may continue to drink clear liquids, such as water, clear fruit juice, black coffee, and plain tea. Eating and drinking restrictions Follow instructions from your health care provider about eating and drinking, which may include:  8 hours before the procedure - stop eating heavy meals or foods such as meat, fried foods, or fatty foods.  6 hours before the procedure - stop eating light meals or foods, such as toast or cereal.  6 hours before the procedure - stop drinking milk or drinks that contain milk.  2 hours before the procedure - stop drinking clear liquids. General instructions  You and your partner may need to have a physical exam and blood and imaging tests to  make sure that you do not have any unknown fertility problems.  Ask your health care provider about: ? Changing or stopping your regular medicines. This is especially important if you take diabetes medicines or blood thinners. ? Taking over-the-counter medicines, vitamins, herbs, and supplements. ? Taking medicines such as aspirin and ibuprofen. These medicines can thin your blood. Do not take these medicines unless your health care provider tells you to take them.  Plan to have someone take you home after the procedure.  If you go home right after the procedure,  plan to have someone with you for 24 hours. What happens during the procedure?  To reduce your risk of infection: ? Your health care team will wash or sanitize their hands. ? Hair may be removed from the surgical area. ? Your skin will be washed with soap.  An IV will be inserted into one of your veins.  You will be given one or more of the following: ? A medicine to help you relax (sedative). ? A medicine that is injected into an area of your body that numbs everything below the injection site (regional anesthetic). ? A medicine that numbs the area (local anesthetic). ? A medicine to make you fall asleep (general anesthetic).  A tube may be put down your throat to help you breathe.  Your bladder may be emptied with a small, thin tube (catheter).  Small cuts (incisions) will be made in your abdomen.  Your abdomen will be inflated with a gas. This will give the surgeon room to work and enable him or her to see your organs clearly.  A thin, lighted tube with a camera (laparoscope) will be inserted through an incision into the pelvic area. Small instruments will be inserted through the other incision.  Any clips, rings, or clamps that were used to close your fallopian tubes will be removed, and any unconnected sections of your fallopian tubes will be reconnected.  Your fallopian tubes will be tested to make sure that they  are open.  The incisions will be closed with stitches (sutures).  A bandage (dressing) will be placed over the incisions. The procedure may vary among health care providers and hospitals. What happens after the procedure?  Your blood pressure, heart rate, breathing rate, and blood oxygen level will be monitored until the medicines you were given have worn off.  You will be given medicine for pain, nausea, or vomiting as needed.  You will be encouraged to walk as soon as possible.  Do not drive for 24 hours if you received a sedative. Summary  Tubal ligation reversal is a procedure to undo a tubal ligation so that a woman can get pregnant again.  During this procedure, the fallopian tubes are reopened, untied, or reconnected to separated sections.  Follow instructions from your health care provider about eating and drinking before the procedure. This information is not intended to replace advice given to you by your health care provider. Make sure you discuss any questions you have with your health care provider. Document Released: 11/30/2011 Document Revised: 05/13/2017 Document Reviewed: 09/09/2016 Elsevier Patient Education  2020 [employer].

## 2019-04-27 NOTE — Progress Notes (Signed)
Patient here to discuss ultrasound report.  

## 2019-04-30 ENCOUNTER — Other Ambulatory Visit: Payer: Self-pay

## 2019-04-30 ENCOUNTER — Telehealth: Payer: Self-pay | Admitting: Certified Nurse Midwife

## 2019-04-30 ENCOUNTER — Ambulatory Visit (INDEPENDENT_AMBULATORY_CARE_PROVIDER_SITE_OTHER): Payer: 59 | Admitting: Certified Nurse Midwife

## 2019-04-30 DIAGNOSIS — R300 Dysuria: Secondary | ICD-10-CM

## 2019-04-30 DIAGNOSIS — N3001 Acute cystitis with hematuria: Secondary | ICD-10-CM

## 2019-04-30 MED ORDER — PHENAZOPYRIDINE HCL 200 MG PO TABS: 200.0000 mg | ORAL_TABLET | Freq: Three times a day (TID) | ORAL | 0 refills | Status: DC | PRN

## 2019-04-30 MED ORDER — NITROFURANTOIN MONOHYD MACRO 100 MG PO CAPS: 100.0000 mg | ORAL_CAPSULE | Freq: Two times a day (BID) | ORAL | 0 refills | Status: DC

## 2019-04-30 NOTE — Telephone Encounter (Signed)
The patient called and stated that she is has a really bad yeast infection and is wanting to know if she needs to come into the office or if a medication needs to be sent into her pharmacy. Pt stated she has pain shooting up her back that is causing her to stay in bed. The pt also has the urgency to urinate and also blood with urine. Please advise.

## 2019-04-30 NOTE — Telephone Encounter (Signed)
Called and spoke with patient, c/o back pain, urinary urgency and noticed blood in urine 3 days ago.  Taking AZO and Ibuprofen, no fever.  Symptoms started "all of a sudden 3 days ago".  Patient scheduled for urine drop off today.

## 2019-04-30 NOTE — Progress Notes (Signed)
Patient dropped off urine, culture sent, prescriptions sent to pharmacy on file.  Patient aware.

## 2019-05-02 LAB — URINE CULTURE

## 2019-05-02 NOTE — Progress Notes (Signed)
I have reviewed the record and concur with patient management and plan of care.    Samyia Motter Michelle Gearlene Godsil, CNM [facility], [facility] 

## 2019-05-03 ENCOUNTER — Ambulatory Visit: Payer: Self-pay | Admitting: Surgery

## 2019-06-11 ENCOUNTER — Encounter: Payer: Self-pay | Admitting: *Deleted

## 2019-07-20 ENCOUNTER — Encounter: Payer: Self-pay | Admitting: *Deleted

## 2021-08-03 IMAGING — US US PELVIS COMPLETE WITH TRANSVAGINAL
1 series · 13 of 25 positions shown · non-contrast
Comparison: CT abdomen pelvis dated September 13, 2011.

CLINICAL DATA: Menorrhagia.

EXAM:
TRANSABDOMINAL AND TRANSVAGINAL ULTRASOUND OF PELVIS
TECHNIQUE: Both transabdominal and transvaginal ultrasound examinations of the
pelvis were performed. Transabdominal technique was performed for
global imaging of the pelvis including uterus, ovaries, adnexal
regions, and pelvic cul-de-sac. It was necessary to proceed with
endovaginal exam following the transabdominal exam to visualize the
uterus and endometrium.

[Series 1: us pelvis complete with transvaginal · 13 of 94 slices shown]
[im 1/94]
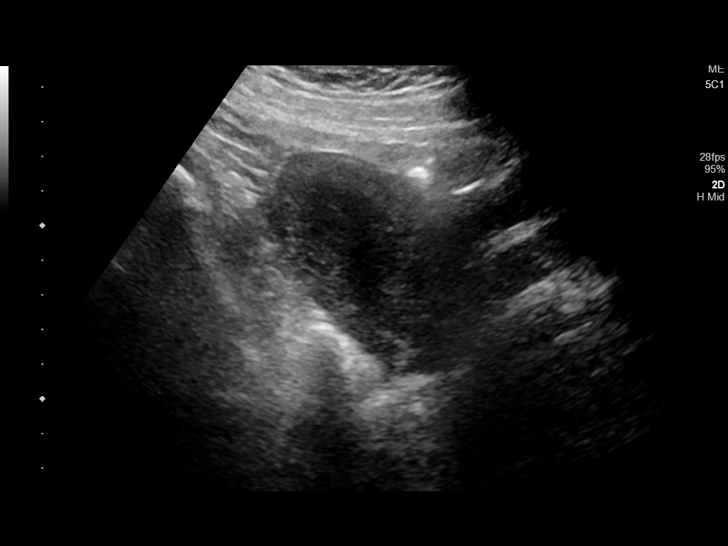
[im 8/94]
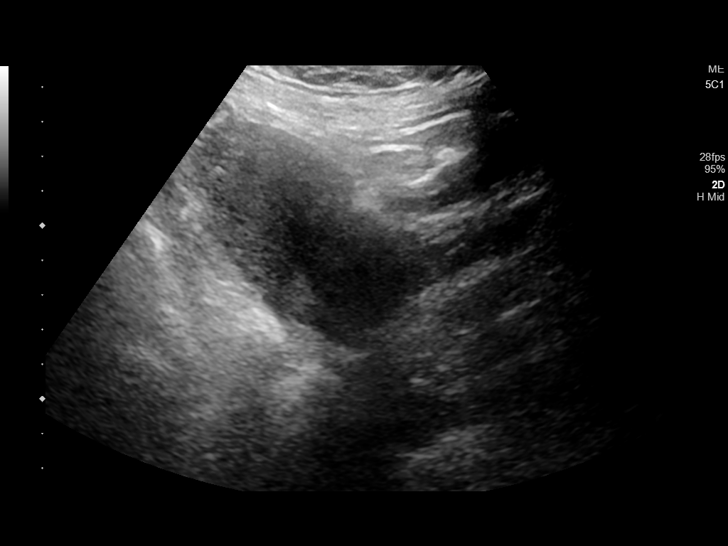
[im 16/94]
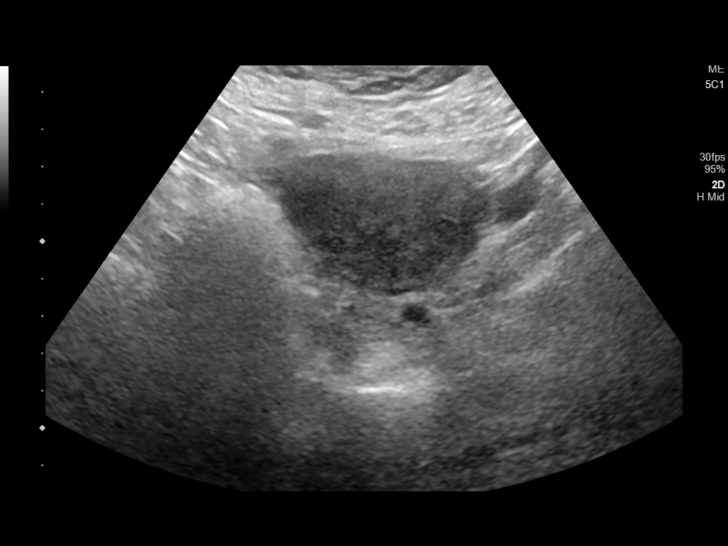
[im 24/94]
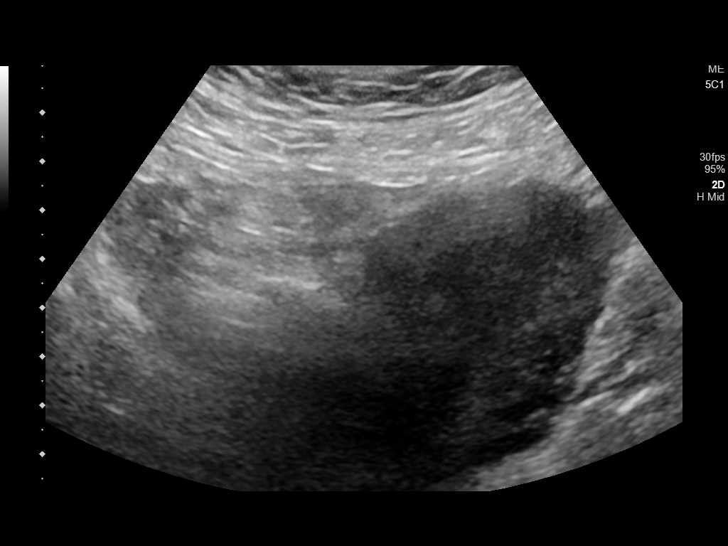
[im 32/94]
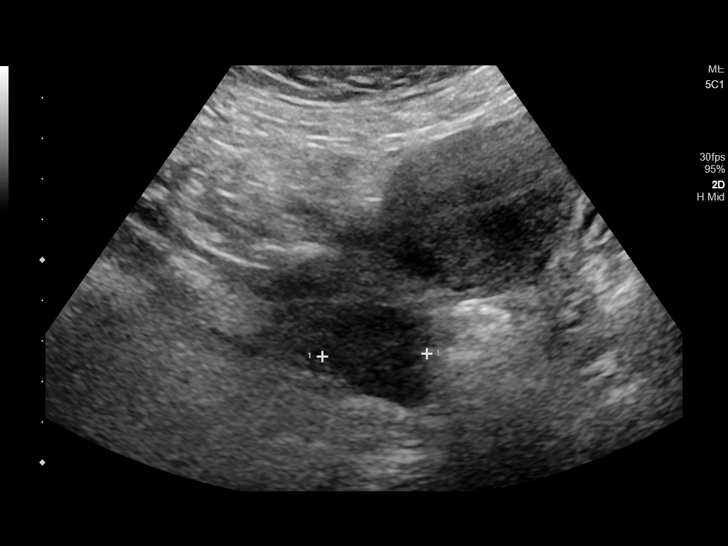
[im 39/94]
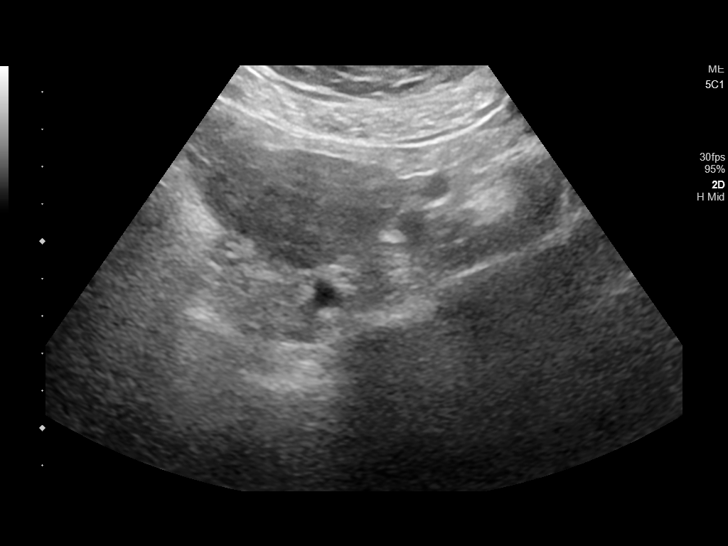
[im 47/94]
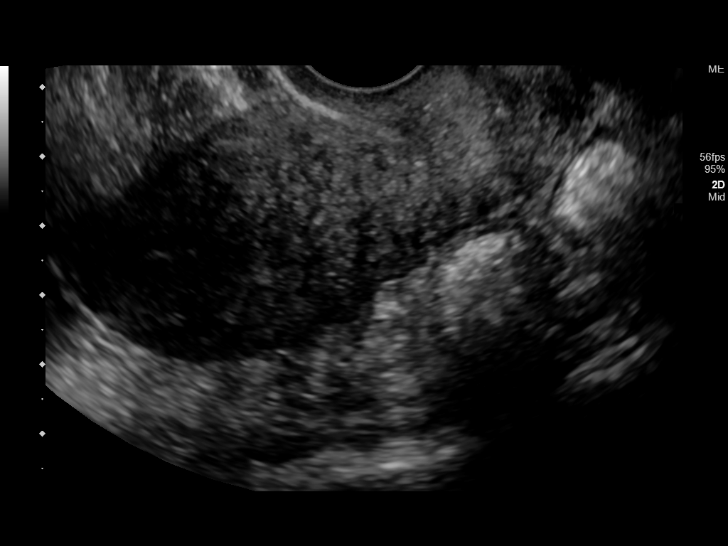
[im 55/94]
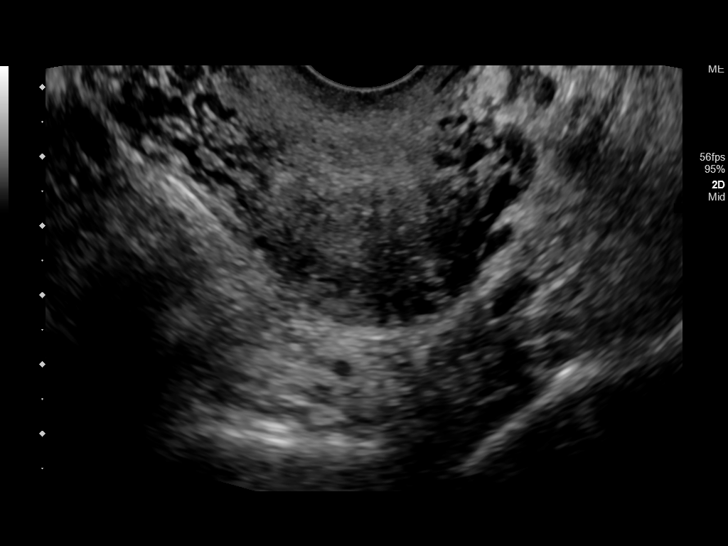
[im 63/94]
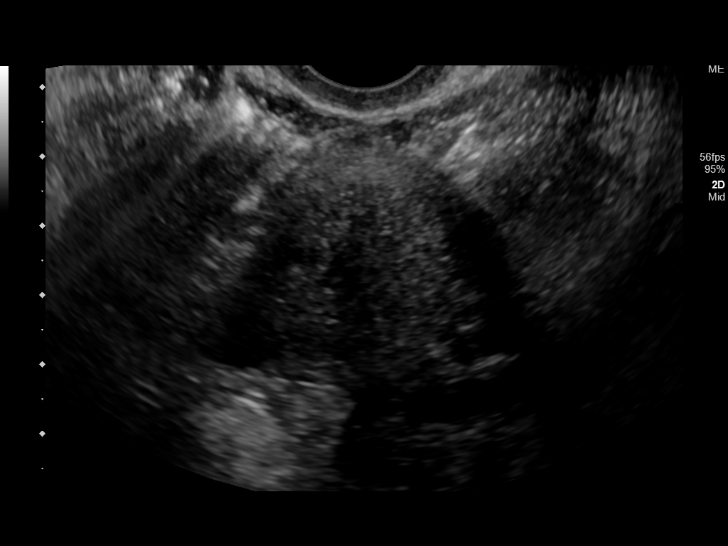
[im 70/94]
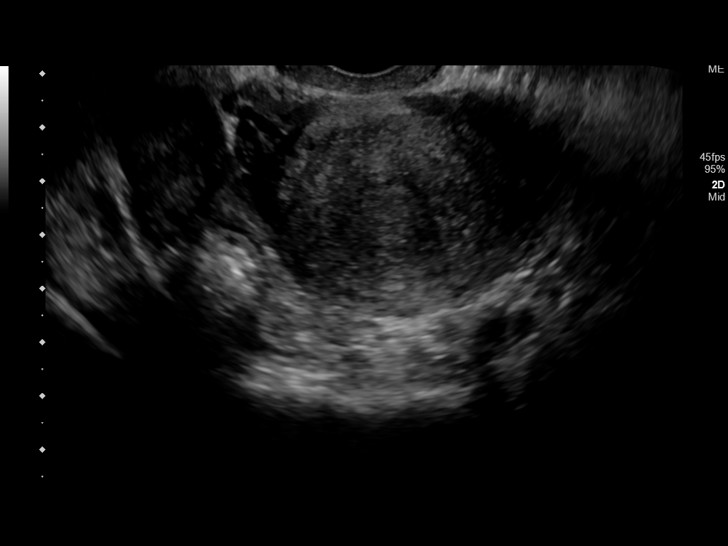
[im 78/94]
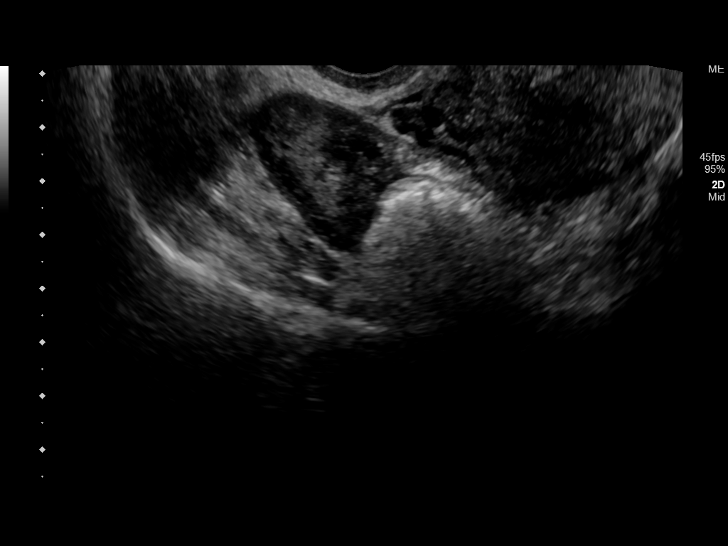
[im 86/94]
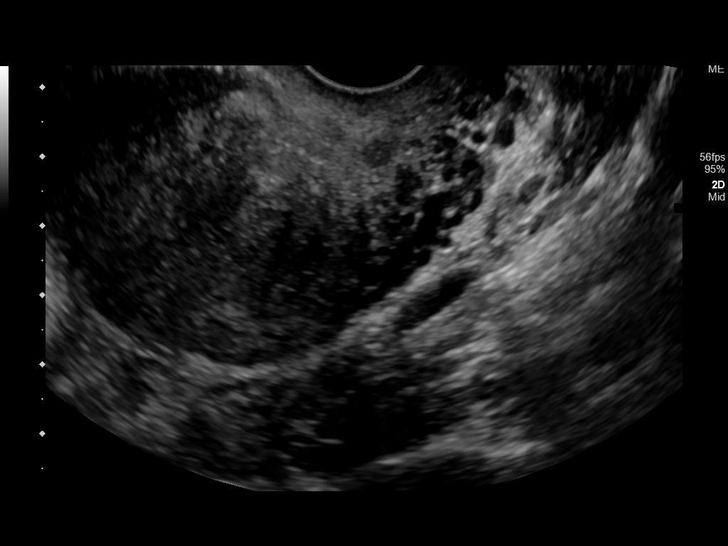
[im 94/94]
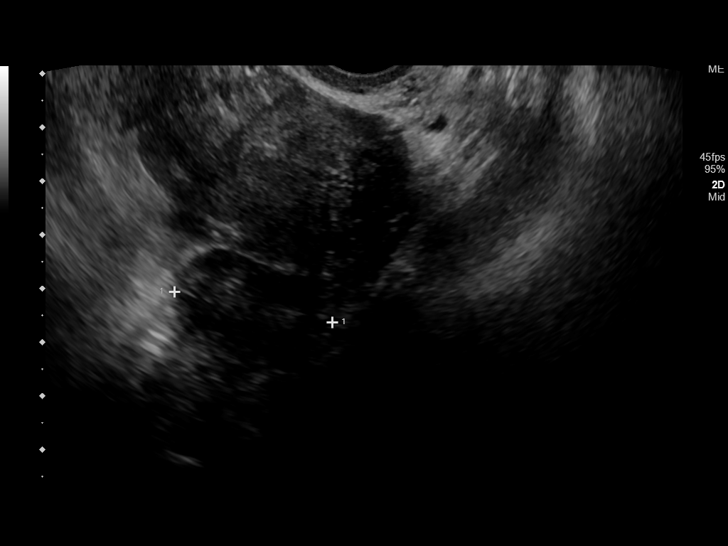

[13 of 25 positions shown; findings below may reference images not displayed]

FINDINGS: Uterus

Measurements: 6.9 x 4.3 x 5.0 cm = volume: 77 mL. No fibroids or
other mass visualized.

Endometrium

Thickness: 3 mm.  No focal abnormality visualized.

Right ovary

Measurements: 4.8 x 2.8 x 5.2 cm = volume: 18 mL. Normal
appearance/no adnexal mass.

Left ovary

Measurements: 3.2 x 1.7 x 3.0 cm = volume: 9 mL. Normal
appearance/no adnexal mass.

Other findings

No abnormal free fluid.
IMPRESSION: 1. Normal pelvic ultrasound. If bleeding remains unresponsive to
hormonal or medical therapy, sonohysterogram should be considered
for focal lesion work-up. (Ref: Radiological Reasoning: Algorithmic
Workup of Abnormal Vaginal Bleeding with Endovaginal Sonography and
Sonohysterography. AJR [id number]; 191:[id number])

## 2021-10-16 ENCOUNTER — Encounter: Payer: Self-pay | Admitting: Obstetrics

## 2021-10-21 ENCOUNTER — Ambulatory Visit (INDEPENDENT_AMBULATORY_CARE_PROVIDER_SITE_OTHER): Payer: 59 | Admitting: Obstetrics

## 2021-10-21 ENCOUNTER — Other Ambulatory Visit (HOSPITAL_COMMUNITY)
Admission: RE | Admit: 2021-10-21 | Discharge: 2021-10-21 | Disposition: A | Discharge status: Home / Self Care | Payer: Self-pay | Source: Ambulatory Visit | Attending: Obstetrics | Admitting: Obstetrics

## 2021-10-21 ENCOUNTER — Encounter: Payer: Self-pay | Admitting: Obstetrics

## 2021-10-21 VITALS — BP 122/70 | Ht 65.0 in | Wt 128.0 lb

## 2021-10-21 DIAGNOSIS — G8929 Other chronic pain: Secondary | ICD-10-CM

## 2021-10-21 DIAGNOSIS — N9089 Other specified noninflammatory disorders of vulva and perineum: Secondary | ICD-10-CM

## 2021-10-21 DIAGNOSIS — R102 Pelvic and perineal pain: Secondary | ICD-10-CM

## 2021-10-21 DIAGNOSIS — Z124 Encounter for screening for malignant neoplasm of cervix: Secondary | ICD-10-CM | POA: Insufficient documentation

## 2021-10-21 DIAGNOSIS — Z113 Encounter for screening for infections with a predominantly sexual mode of transmission: Secondary | ICD-10-CM

## 2021-10-21 DIAGNOSIS — Z01419 Encounter for gynecological examination (general) (routine) without abnormal findings: Secondary | ICD-10-CM | POA: Diagnosis not present

## 2021-10-21 NOTE — Progress Notes (Signed)
? ? ? ?Gynecology Annual Exam   ?PCP: Renee Rival, NP ? ?Chief Complaint: No chief complaint on file. ? ? ?History of Present Illness: Patient is a 36 y.o. [id number] presents for annual exam. Per chart review she has had a complete workup for menorrhagia and pelvic pain previously at [facility]. She is a new patient and has not had a pap smear in over a decade. She is overdue for a Well Woman physica, but really wants to discuss her chronic pelvic pain. Her history is significant for a BTL with reversal. She has not been able to get pregnant post reversal, and is undecided whether she will try for another pregnancy. ?She used a Public affairs consultant group for her infertility and tubal reversal. ?Today she shares a long hx of pelvic pain, which per the records, was worked up at [facility] two years ago. ? ?LMP: No LMP recorded. ?Average Interval: regular,  uncertain  days ?Duration of flow: 7 days ?Heavy Menses: yes ?Clots: occasionally ?Intermenstrual Bleeding: no ?Postcoital Bleeding: no ?Dysmenorrhea: yes- describes this as severe ? ?The patient is sexually active. She currently uses none for contraception. She denies dyspareunia.  The patient does not perform self breast exams.  There is notable family history of breast or ovarian cancer in her family. She has had BRCA testing that was negative. ? ?The patient wears seatbelts: yes.   The patient has regular exercise: yes.   ? ?The patient denies current symptoms of depression.   ? ?Review of Systems: Review of Systems  ?Constitutional:  Positive for malaise/fatigue.  ?HENT: Negative.    ?Eyes: Negative.   ?Respiratory: Negative.    ?Cardiovascular: Negative.   ?Gastrointestinal:   ?     Occasional N and V  ?Genitourinary: Negative.   ?Musculoskeletal: Negative.   ?Skin: Negative.   ?Neurological: Negative.   ?Endo/Heme/Allergies: Negative.   ?Psychiatric/Behavioral:  The patient is nervous/anxious.   ? ?Past Medical History:  ?Patient Active Problem List  ? Diagnosis Date  Noted  ? Menorrhagia with irregular cycle 04/23/2019  ? Dysmenorrhea 04/23/2019  ? Skin tag of female perineum 04/23/2019  ? Fatigue 10/02/2014  ? Abdominal pain 12/16/2012  ? Chest pain 02/07/2012  ? Odynophagia 02/07/2012  ? ? ?Past Surgical History:  ?Past Surgical History:  ?Procedure Laterality Date  ? ESOPHAGOGASTRODUODENOSCOPY  02/08/2012  ? SLF:Non-erosive gastritis (inflammation) was found in the gastric antrum/ The mucosa of the esophagus appeared normal; multiple biopsies, path: negative h.pylori  ? ESOPHAGOGASTRODUODENOSCOPY (EGD) WITH PROPOFOL N/A 10/11/2017  ? Procedure: ESOPHAGOGASTRODUODENOSCOPY (EGD) WITH  PROPOFOL AND DILATION;  Surgeon: Jonathon Bellows, MD;  Location: [facility];  Service: Gastroenterology;  Laterality: N/A;  ? TUBAL LIGATION    ? tubal reversal    ? ? ?Gynecologic History:  ?No LMP recorded. ?Contraception: none ?Last Pap: Results were:  she does not remember- over 11 years ago   ? ?Obstetric History: [id number] ? ?Family History:  ?Family History  ?Problem Relation Age of Onset  ? Colon cancer Maternal Grandfather   ? Breast cancer Paternal Grandmother   ? Ovarian cancer Neg Hx   ? ? ?Social History:  ?Social History  ? ?Socioeconomic History  ? Marital status: Legally Separated  ?  Spouse name: Not on file  ? Number of children: Not on file  ? Years of education: Not on file  ? Highest education level: Not on file  ?Occupational History  ? Occupation: hairdresser  ?  Comment: [employer], owner since 2008  ?Tobacco Use  ?  Smoking status: Every Day  ?  Packs/day: 1.00  ?  Types: Cigarettes  ? Smokeless tobacco: Never  ?Vaping Use  ? Vaping Use: Never used  ?Substance and Sexual Activity  ? Alcohol use: Yes  ?  Alcohol/week: 0.0 standard drinks  ?  Comment: occasional   ? Drug use: No  ? Sexual activity: Yes  ?  Birth control/protection: None  ?Other Topics Concern  ? Not on file  ?Social History Narrative  ? Not on file  ? ?Social Determinants of Health  ? ?Financial  Resource Strain: Not on file  ?Food Insecurity: Not on file  ?Transportation Needs: Not on file  ?Physical Activity: Not on file  ?Stress: Not on file  ?Social Connections: Not on file  ?Intimate Partner Violence: Not on file  ? ? ?Allergies:  ?Allergies  ?Allergen Reactions  ? Sulfa Antibiotics Rash  ? ? ?Medications: ?Prior to Admission medications   ?Medication Sig Start Date End Date Taking? Authorizing Provider  ?clonazePAM (KLONOPIN) 0.5 MG tablet Take 0.5 mg by mouth 2 (two) times daily.    [provider]  ?nitrofurantoin, macrocrystal-monohydrate, (MACROBID) 100 MG capsule Take 1 capsule (100 mg total) by mouth 2 (two) times daily. 04/30/19   Lawhorn, Lara Mulch, CNM  ?phenazopyridine (PYRIDIUM) 200 MG tablet Take 1 tablet (200 mg total) by mouth 3 (three) times daily as needed for pain. 04/30/19   Diona Fanti, CNM  ? ? ?Physical Exam ?Vitals: There were no vitals taken for this visit. ? ?General: NAD ?HEENT: normocephalic, anicteric ?Thyroid: no enlargement, no palpable nodules ?Pulmonary: No increased work of breathing, CTAB ?Cardiovascular: RRR, distal pulses 2+ ?Breast: Breast symmetrical, no tenderness, no palpable nodules or masses, no skin or nipple retraction present, no nipple discharge.  No axillary or supraclavicular lymphadenopathy. ?Abdomen: NABS, soft, non-tender, non-distended.  Umbilicus without lesions.  No hepatomegaly, splenomegaly or masses palpable. No evidence of hernia  ?Genitourinary: ? External: Normal external female genitalia.  Normal urethral meatus, normal Bartholin's and Skene's glands.  She has a visible condyloma or skin tag near her perineum that she would like removed. ? Vagina: Normal vaginal mucosa, no evidence of prolapse.   ? Cervix: Grossly normal in appearance, no bleeding ? Uterus: Non-enlarged, mobile, normal contour.  No CMT ? Adnexa: ovaries non-enlarged, no adnexal masses ? Rectal: deferred ? Lymphatic: no evidence of inguinal  lymphadenopathy ?Extremities: no edema, erythema, or tenderness ?Neurologic: Grossly intact ?Psychiatric: mood appropriate, affect full ? ?Female chaperone present for pelvic and breast  portions of the physical exam ? ? ? ?Assessment: 36 y.o. G3P3003 routine annual exam ?Chronic pelvic pain ?Perineal skin tag ? ?Plan: ?Problem List Items Addressed This Visit   ?None ? ? ?2) STI screening  wasoffered and accepted ? ?2)  ASCCP guidelines and rational discussed.  Patient opts for yearly screening interval ? ?3) Contraception - the patient is currently using  none.  She is  still deciding whether to pursue another pregnancy- ? ?4) Routine healthcare maintenance including cholesterol, diabetes screening discussed managed by PCP ? ?Lengthy discussion regarding her health priority. She desired another pregnancy post tubal reversal, but has struggled with chronic pelvic pain and this seems more of a priority today. I have discussed that a referral will differ based on whether she desires a pregnancy or wants to address the pelvic pain and dymennorrhea ? ?Pap smear and cultures retrieved today. Will review with her and she plans a discussion with her partner regarding future pregnancy.I have offered to  refer her to the [facility] pain clinic. ? ?5) No follow-ups on file. ? ?Additional time spent with this patient today addressing beyond the Well Woman Physical, so is coded for additional problem visit. A total of 45 minutes spent addressing her issues today. ? ?Imagene Riches, CNM  ?10/21/2021 1:40 PM  ? ?[facility] ?10/21/2021, 1:40 PM ? ? ?  ?

## 2021-10-22 LAB — HEP, RPR, HIV PANEL
HIV Screen 4th Generation wRfx: NONREACTIVE
Hepatitis B Surface Ag: NEGATIVE
RPR Ser Ql: NONREACTIVE

## 2021-10-23 LAB — CERVICOVAGINAL ANCILLARY ONLY
Bacterial Vaginitis (gardnerella): POSITIVE — AB
Chlamydia: NEGATIVE
Comment: NEGATIVE
Comment: NEGATIVE
Comment: NEGATIVE
Comment: NORMAL
Neisseria Gonorrhea: NEGATIVE
Trichomonas: NEGATIVE

## 2021-10-27 LAB — CYTOLOGY - PAP
Comment: NEGATIVE
Diagnosis: UNDETERMINED — AB
High risk HPV: NEGATIVE

## 2021-10-28 ENCOUNTER — Encounter: Payer: Self-pay | Admitting: Obstetrics

## 2021-10-28 ENCOUNTER — Other Ambulatory Visit: Payer: Self-pay | Admitting: Obstetrics

## 2021-10-28 DIAGNOSIS — B9689 Other specified bacterial agents as the cause of diseases classified elsewhere: Secondary | ICD-10-CM

## 2021-10-28 MED ORDER — METRONIDAZOLE 500 MG PO TABS: 500.0000 mg | ORAL_TABLET | Freq: Two times a day (BID) | ORAL | 0 refills | Status: AC

## 2021-10-28 NOTE — Progress Notes (Signed)
Notes sent to pateint as her VM is not set up. Allyne Gee has an ASCUS pap and + BV per testing. Rx for Flagyl sent to her pharmacy. ? ?Mirna Mires, CNM  ?10/28/2021 6:04 PM  ? ?

## 2021-11-10 ENCOUNTER — Ambulatory Visit (INDEPENDENT_AMBULATORY_CARE_PROVIDER_SITE_OTHER): Payer: 59 | Admitting: Family Medicine

## 2021-11-10 ENCOUNTER — Other Ambulatory Visit (HOSPITAL_COMMUNITY)
Admission: RE | Admit: 2021-11-10 | Discharge: 2021-11-10 | Disposition: A | Discharge status: Home / Self Care | Payer: Self-pay | Source: Ambulatory Visit | Attending: Family Medicine | Admitting: Family Medicine

## 2021-11-10 ENCOUNTER — Encounter: Payer: Self-pay | Admitting: Family Medicine

## 2021-11-10 VITALS — BP 122/70 | Ht 65.0 in | Wt 128.0 lb

## 2021-11-10 DIAGNOSIS — L989 Disorder of the skin and subcutaneous tissue, unspecified: Secondary | ICD-10-CM | POA: Insufficient documentation

## 2021-11-10 NOTE — Progress Notes (Signed)
   GYNECOLOGY PROBLEM  VISIT ENCOUNTER NOTE  Subjective:   Briana Norton is a 36 y.o. G31P3003 female here for a problem GYN visit.  Current complaints: skin lesion on left perineum growing over the past 8 years. Reports having area frozen off in the past.   Wants it removed.   Denies abnormal vaginal bleeding, discharge, pelvic pain, problems with intercourse or other gynecologic concerns.     Health Maintenance Due  Topic Date Due   COVID-19 Vaccine (1) Never done   Hepatitis C Screening  Never done   TETANUS/TDAP  Never done    The following portions of the patient's history were reviewed and updated as appropriate: allergies, current medications, past family history, past medical history, past social history, past surgical history and problem list.  Review of Systems Pertinent items are noted in HPI.   Objective:  BP 122/70   Ht 5\' 5"  (1.651 m)   Wt 128 lb (58.1 kg)   BMI 21.30 kg/m  Gen: well appearing, NAD HEENT: no scleral icterus CV: RR Lung: Normal WOB Ext: warm well perfused  PELVIC: Normal appearing external genitalia; Has a 0.88mm slightly hypopigmented raised lesion on the right perineum. Thin stalk.   Verbal consent obtained. Areas cleaned with alcohol. Injected 1 cc lidocaine to the base of lesions. Cleaned area with betadine x 3. Used forceps to stabilized pedunculated mass and used 11 blade to excise the lesion. 3-0 vicryl used to close defect.  Good hemostasis noted. Knots are slightly above skin edge. Counseled to return if not dissolving.   Assessment and Plan:  1. Skin lesion Likely condyloma based on appearance.  - Surgical pathology - Reviewed results will appear in mychart in 3-5 days.    Please refer to After Visit Summary for other counseling recommendations.   No follow-ups on file.  4m, MD, MPH, ABFM Attending Physician Faculty Practice- [facility] for [facility]

## 2021-11-13 LAB — SURGICAL PATHOLOGY

## 2021-12-08 ENCOUNTER — Encounter: Payer: Self-pay | Admitting: Obstetrics

## 2021-12-10 ENCOUNTER — Other Ambulatory Visit: Payer: Self-pay | Admitting: Obstetrics

## 2021-12-10 DIAGNOSIS — Z8742 Personal history of other diseases of the female genital tract: Secondary | ICD-10-CM

## 2021-12-10 DIAGNOSIS — Z30011 Encounter for initial prescription of contraceptive pills: Secondary | ICD-10-CM

## 2021-12-10 MED ORDER — NORETHIN ACE-ETH ESTRAD-FE 1-20 MG-MCG PO TABS: 1.0000 | ORAL_TABLET | Freq: Every day | ORAL | 4 refills | Status: AC

## 2021-12-10 NOTE — Telephone Encounter (Signed)
Patient reached out via My Chart requesting OCPs to address onging vaginal bleeding. At her last appt, she was considering another pregnancy (hx of BTL and then reversal).  Now has decided that plan is "on hold". Has been bleeding for several weeks. Has a documented hx of heavier flow. I have prescribed Junel for her. Also left her a VM offering some consultation regarding future means of addressing the bleeding, including ablation as an option.  Mirna Mires, CNM  12/10/2021 11:34 AM

## 2022-01-31 ENCOUNTER — Other Ambulatory Visit: Payer: Self-pay

## 2022-01-31 ENCOUNTER — Emergency Department
Admission: EM | Admit: 2022-01-31 | Discharge: 2022-02-01 | Disposition: A | Discharge status: Home / Self Care | Payer: Self-pay | Attending: Emergency Medicine | Admitting: Emergency Medicine

## 2022-01-31 DIAGNOSIS — F10929 Alcohol use, unspecified with intoxication, unspecified: Secondary | ICD-10-CM

## 2022-01-31 DIAGNOSIS — F17210 Nicotine dependence, cigarettes, uncomplicated: Secondary | ICD-10-CM | POA: Insufficient documentation

## 2022-01-31 DIAGNOSIS — R4689 Other symptoms and signs involving appearance and behavior: Secondary | ICD-10-CM | POA: Insufficient documentation

## 2022-01-31 DIAGNOSIS — Y908 Blood alcohol level of 240 mg/100 ml or more: Secondary | ICD-10-CM | POA: Insufficient documentation

## 2022-01-31 DIAGNOSIS — Z20822 Contact with and (suspected) exposure to covid-19: Secondary | ICD-10-CM | POA: Insufficient documentation

## 2022-01-31 DIAGNOSIS — F1914 Other psychoactive substance abuse with psychoactive substance-induced mood disorder: Secondary | ICD-10-CM | POA: Insufficient documentation

## 2022-01-31 DIAGNOSIS — J662 Cannabinosis: Secondary | ICD-10-CM | POA: Insufficient documentation

## 2022-01-31 DIAGNOSIS — F10120 Alcohol abuse with intoxication, uncomplicated: Secondary | ICD-10-CM | POA: Insufficient documentation

## 2022-01-31 DIAGNOSIS — F10920 Alcohol use, unspecified with intoxication, uncomplicated: Secondary | ICD-10-CM

## 2022-01-31 LAB — CBC
HCT: 46.5 % — ABNORMAL HIGH (ref 36.0–46.0)
Hemoglobin: 15.7 g/dL — ABNORMAL HIGH (ref 12.0–15.0)
MCH: 32.2 pg (ref 26.0–34.0)
MCHC: 33.8 g/dL (ref 30.0–36.0)
MCV: 95.3 fL (ref 80.0–100.0)
Platelets: 244 10*3/uL (ref 150–400)
RBC: 4.88 MIL/uL (ref 3.87–5.11)
RDW: 11.9 % (ref 11.5–15.5)
WBC: 6.6 10*3/uL (ref 4.0–10.5)
nRBC: 0 % (ref 0.0–0.2)

## 2022-01-31 LAB — POC URINE PREG, ED: Preg Test, Ur: NEGATIVE

## 2022-01-31 LAB — COMPREHENSIVE METABOLIC PANEL
ALT: 74 U/L — ABNORMAL HIGH (ref 0–44)
AST: 91 U/L — ABNORMAL HIGH (ref 15–41)
Albumin: 4.9 g/dL (ref 3.5–5.0)
Alkaline Phosphatase: 49 U/L (ref 38–126)
Anion gap: 14 (ref 5–15)
BUN: <5 mg/dL — ABNORMAL LOW (ref 6–20)
CO2: 22 mmol/L (ref 22–32)
Calcium: 9.3 mg/dL (ref 8.9–10.3)
Chloride: 104 mmol/L (ref 98–111)
Creatinine, Ser: 0.75 mg/dL (ref 0.44–1.00)
GFR, Estimated: >60 mL/min (ref >60)
Glucose, Bld: 100 mg/dL — ABNORMAL HIGH (ref 70–99)
Potassium: 4.4 mmol/L (ref 3.5–5.1)
Sodium: 140 mmol/L (ref 135–145)
Total Bilirubin: 0.5 mg/dL (ref 0.3–1.2)
Total Protein: 9.1 g/dL — ABNORMAL HIGH (ref 6.5–8.1)

## 2022-01-31 LAB — SALICYLATE LEVEL: Salicylate Lvl: <7 mg/dL — ABNORMAL LOW (ref 7.0–30.0)

## 2022-01-31 LAB — ACETAMINOPHEN LEVEL: Acetaminophen (Tylenol), Serum: <10 ug/mL — ABNORMAL LOW (ref 10–30)

## 2022-01-31 NOTE — ED Notes (Signed)
Pt dressed out in triage with this nurse and Idalia Needle, EDT in room.  Pt belongings placed in hospital belongings bag and labeled appropriately with pt information  Items include: 1 black bra 1 black tank top 1 grey sweatpants 1 pair green underwear 2 silver necklaces and orange hair tie placed in specimen cup with pt label, and placed in bag
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
## 2022-02-01 DIAGNOSIS — F10929 Alcohol use, unspecified with intoxication, unspecified: Secondary | ICD-10-CM

## 2022-02-01 DIAGNOSIS — F10920 Alcohol use, unspecified with intoxication, uncomplicated: Secondary | ICD-10-CM

## 2022-02-01 LAB — ETHANOL: Alcohol, Ethyl (B): 299 mg/dL — ABNORMAL HIGH (ref <10)

## 2022-02-01 LAB — URINE DRUG SCREEN, QUALITATIVE (ARMC ONLY)
Amphetamines, Ur Screen: NOT DETECTED
Barbiturates, Ur Screen: NOT DETECTED
Benzodiazepine, Ur Scrn: NOT DETECTED
Cannabinoid 50 Ng, Ur ~~LOC~~: POSITIVE — AB
Cocaine Metabolite,Ur ~~LOC~~: NOT DETECTED
MDMA (Ecstasy)Ur Screen: NOT DETECTED
Methadone Scn, Ur: NOT DETECTED
Opiate, Ur Screen: NOT DETECTED
Phencyclidine (PCP) Ur S: NOT DETECTED
Tricyclic, Ur Screen: NOT DETECTED

## 2022-02-01 LAB — RESP PANEL BY RT-PCR (FLU A&B, COVID) ARPGX2
Influenza A by PCR: NEGATIVE
Influenza B by PCR: NEGATIVE
SARS Coronavirus 2 by RT PCR: NEGATIVE

## 2022-02-01 MED ORDER — IBUPROFEN 600 MG PO TABS
600.0000 mg | ORAL_TABLET | Freq: Once | ORAL | Status: DC
  Filled 2022-02-01: qty 1

## 2022-02-01 MED ORDER — ONDANSETRON 4 MG PO TBDP
4.0000 mg | ORAL_TABLET | Freq: Once | ORAL | Status: AC
  Administered 2022-02-01: 4 mg via ORAL
  Filled 2022-02-01: qty 1

## 2022-02-01 NOTE — Consult Note (Signed)
[facility] Face-to-Face Psychiatry Consult   Reason for Consult:  suicidal comments in context of alcohol intoxication Referring Physician:  EDP Patient Identification: Briana Norton MRN:  [id number] Principal Diagnosis: Alcohol intoxication (HCC) Diagnosis:  Principal Problem:   Alcohol intoxication (HCC)   Total Time spent with patient: 45 minutes  Subjective:  "I cut my child's phone off and they got mad and called my mom."  Briana Norton is a 36 y.o. female patient admitted with alcohol intoxication.  HPI:  Patient is alert and oriented x 2. Speaking in clear, coherent sentences, is lucid. She denies suicidal or homicidal ideations. Denies auditory or visual hallucinations and does not appear to be responding to internal stimuli. Patient reports that she does not feel that she drinks too much. She says "socially" but vague abut the amount and if she regularly drinks enough to get intoxicated. BAL last evening @ 2319 was 299. UTOX positive for cannabinoids. Patient reports drinking only on the week-end. Reports having full-time job with insurance. She states her PCP prescribes medication for anxiety. (PDMP shows clonazepam). Discussion with patient regarding potential benefits of seeing mental health provider for her symptoms of anxiety. She denies depression.   Patient is likely minimizing her alcohol use and its effects  on her actions. However, she no longer meets criteria for involuntary commitment and is requesting discharge.   Past Psychiatric History: denies prior history of mental health service. States she has anxiety and prescribed clonazepam by PCP. No prior psych admissions.   Risk to Self:   Risk to Others:   Prior Inpatient Therapy:   Prior Outpatient Therapy:    Past Medical History:  Past Medical History:  Diagnosis Date   Endometriosis    Hx of menorrhagia    ? endometriosis   IBS (irritable bowel syndrome)     Past Surgical History:   Procedure Laterality Date   ESOPHAGOGASTRODUODENOSCOPY  02/08/2012   SLF:Non-erosive gastritis (inflammation) was found in the gastric antrum/ The mucosa of the esophagus appeared normal; multiple biopsies, path: negative h.pylori   ESOPHAGOGASTRODUODENOSCOPY (EGD) WITH PROPOFOL N/A 10/11/2017   Procedure: ESOPHAGOGASTRODUODENOSCOPY (EGD) WITH  PROPOFOL AND DILATION;  Surgeon: Wyline Mood, MD;  Location: [facility] ENDOSCOPY;  Service: Gastroenterology;  Laterality: N/A;   TUBAL LIGATION     tubal reversal     Family History:  Family History  Problem Relation Age of Onset   Colon cancer Maternal Grandfather    Breast cancer Paternal Grandmother    Ovarian cancer Neg Hx    Family Psychiatric  History: none reported Social History:  Social History   Substance and Sexual Activity  Alcohol Use Yes   Alcohol/week: 0.0 standard drinks of alcohol   Comment: occasional      Social History   Substance and Sexual Activity  Drug Use Yes   Types: Marijuana   Comment: CBD    Social History   Socioeconomic History   Marital status: Divorced    Spouse name: Not on file   Number of children: Not on file   Years of education: Not on file   Highest education level: Not on file  Occupational History   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[employer], owner since 2008  Tobacco Use   Smoking status: Every Day    Packs/day: 1.00    Types: Cigarettes   Smokeless tobacco: Never  Vaping Use   Vaping Use: Never used  Substance and Sexual Activity   Alcohol use: Yes    Alcohol/week:  0.0 standard drinks of alcohol    Comment: occasional    Drug use: Yes    Types: Marijuana    Comment: CBD   Sexual activity: Yes    Birth control/protection: None  Other Topics Concern   Not on file  Social History Narrative   Not on file   Social Determinants of Health   Financial Resource Strain: Not on file  Food Insecurity: Not on file  Transportation Needs: Not on file  Physical  Activity: Not on file  Stress: Not on file  Social Connections: Not on file   Additional Social History:    Allergies:   Allergies  Allergen Reactions   Sulfa Antibiotics Rash    Labs:  Results for orders placed or performed during the hospital encounter of 01/31/22 (from the past 48 hour(s))  Comprehensive metabolic panel     Status: Abnormal   Collection Time: 01/31/22 11:19 PM  Result Value Ref Range   Sodium 140 135 - 145 mmol/L   Potassium 4.4 3.5 - 5.1 mmol/L   Chloride 104 98 - 111 mmol/L   CO2 22 22 - 32 mmol/L   Glucose, Bld 100 (H) 70 - 99 mg/dL    Comment: Glucose reference range applies only to samples taken after fasting for at least 8 hours.   BUN <5 (L) 6 - 20 mg/dL   Creatinine, Ser 3.50 0.44 - 1.00 mg/dL   Calcium 9.3 8.9 - 09.3 mg/dL   Total Protein 9.1 (H) 6.5 - 8.1 g/dL   Albumin 4.9 3.5 - 5.0 g/dL   AST 91 (H) 15 - 41 U/L   ALT 74 (H) 0 - 44 U/L   Alkaline Phosphatase 49 38 - 126 U/L   Total Bilirubin 0.5 0.3 - 1.2 mg/dL   GFR, Estimated >81 >82 mL/min    Comment: (NOTE) Calculated using the CKD-EPI Creatinine Equation (2021)    Anion gap 14 5 - 15    Comment: Performed at [facility], [address] [place], [place] [address]  Ethanol     Status: Abnormal   Collection Time: 01/31/22 11:19 PM  Result Value Ref Range   Alcohol, Ethyl (B) 299 (H) <10 mg/dL    Comment: (NOTE) Lowest detectable limit for serum alcohol is 10 mg/dL.  For medical purposes only. Performed at [facility], [address] [place], [place] [address]   Salicylate level     Status: Abnormal   Collection Time: 01/31/22 11:19 PM  Result Value Ref Range   Salicylate Lvl <7.0 (L) 7.0 - 30.0 mg/dL    Comment: Performed at [facility], [address] [place], [place] [address]  Acetaminophen level     Status: Abnormal   Collection Time: 01/31/22 11:19 PM  Result Value Ref Range   Acetaminophen (Tylenol), Serum <10 (L) 10 - 30 ug/mL     Comment: (NOTE) Therapeutic concentrations vary significantly. A range of 10-30 ug/mL  may be an effective concentration for many patients. However, some  are best treated at concentrations outside of this range. Acetaminophen concentrations >150 ug/mL at 4 hours after ingestion  and >50 ug/mL at 12 hours after ingestion are often associated with  toxic reactions.  Performed at [facility], [address] [place], [place] [address]   cbc     Status: Abnormal   Collection Time: 01/31/22 11:19 PM  Result Value Ref Range   WBC 6.6 4.0 - 10.5 K/uL   RBC 4.88 3.87 - 5.11 MIL/uL  Hemoglobin 15.7 (H) 12.0 - 15.0 g/dL   HCT 23.5 (H) 36.1 - 44.3 %   MCV 95.3 80.0 - 100.0 fL   MCH 32.2 26.0 - 34.0 pg   MCHC 33.8 30.0 - 36.0 g/dL   RDW 15.4 00.8 - 67.6 %   Platelets 244 150 - 400 K/uL   nRBC 0.0 0.0 - 0.2 %    Comment: Performed at [facility], [address] [place], [place] [address]  Urine Drug Screen, Qualitative     Status: Abnormal   Collection Time: 01/31/22 11:19 PM  Result Value Ref Range   Tricyclic, Ur Screen NONE DETECTED NONE DETECTED   Amphetamines, Ur Screen NONE DETECTED NONE DETECTED   MDMA (Ecstasy)Ur Screen NONE DETECTED NONE DETECTED   Cocaine Metabolite,Ur [place] NONE DETECTED NONE DETECTED   Opiate, Ur Screen NONE DETECTED NONE DETECTED   Phencyclidine (PCP) Ur S NONE DETECTED NONE DETECTED   Cannabinoid 50 Ng, Ur [place] POSITIVE (A) NONE DETECTED   Barbiturates, Ur Screen NONE DETECTED NONE DETECTED   Benzodiazepine, Ur Scrn NONE DETECTED NONE DETECTED   Methadone Scn, Ur NONE DETECTED NONE DETECTED    Comment: (NOTE) Tricyclics + metabolites, urine    Cutoff 1000 ng/mL Amphetamines + metabolites, urine  Cutoff 1000 ng/mL MDMA (Ecstasy), urine              Cutoff 500 ng/mL Cocaine Metabolite, urine          Cutoff 300 ng/mL Opiate + metabolites, urine        Cutoff 300 ng/mL Phencyclidine (PCP), urine         Cutoff 25 ng/mL Cannabinoid, urine                  Cutoff 50 ng/mL Barbiturates + metabolites, urine  Cutoff 200 ng/mL Benzodiazepine, urine              Cutoff 200 ng/mL Methadone, urine                   Cutoff 300 ng/mL  The urine drug screen provides only a preliminary, unconfirmed analytical test result and should not be used for non-medical purposes. Clinical consideration and professional judgment should be applied to any positive drug screen result due to possible interfering substances. A more specific alternate chemical method must be used in order to obtain a confirmed analytical result. Gas chromatography / mass spectrometry (GC/MS) is the preferred confirm atory method. Performed at [facility], [address] [place], [place] [address]   POC urine preg, ED     Status: None   Collection Time: 01/31/22 11:23 PM  Result Value Ref Range   Preg Test, Ur NEGATIVE NEGATIVE    Comment:        THE SENSITIVITY OF THIS METHODOLOGY IS >24 mIU/mL   Resp Panel by RT-PCR (Flu A&B, Covid) Anterior Nasal Swab     Status: None   Collection Time: 01/31/22 11:40 PM   Specimen: Anterior Nasal Swab  Result Value Ref Range   SARS Coronavirus 2 by RT PCR NEGATIVE NEGATIVE    Comment: (NOTE) SARS-CoV-2 target nucleic acids are NOT DETECTED.  The SARS-CoV-2 RNA is generally detectable in upper respiratory specimens during the acute phase of infection. The lowest concentration of SARS-CoV-2 viral copies this assay can detect is 138 copies/mL. A negative result does not preclude SARS-Cov-2 infection and should not be used as the sole basis for treatment or other patient management decisions. A negative result may  occur with  improper specimen collection/handling, submission of specimen other than nasopharyngeal swab, presence of viral mutation(s) within the areas targeted by this assay, and inadequate number of viral copies(<138 copies/mL). A negative result must be combined with clinical observations, patient  history, and epidemiological information. The expected result is Negative.  Fact Sheet for Patients:  BloggerCourse.com  Fact Sheet for Healthcare Providers:  SeriousBroker.it  This test is no t yet approved or cleared by the [place] FDA and  has been authorized for detection and/or diagnosis of SARS-CoV-2 by FDA under an Emergency Use Authorization (EUA). This EUA will remain  in effect (meaning this test can be used) for the duration of the COVID-19 declaration under Section 564(b)(1) of the Act, 21 U.S.C.section 360bbb-3(b)(1), unless the authorization is terminated  or revoked sooner.       Influenza A by PCR NEGATIVE NEGATIVE   Influenza B by PCR NEGATIVE NEGATIVE    Comment: (NOTE) The Xpert Xpress SARS-CoV-2/FLU/RSV plus assay is intended as an aid in the diagnosis of influenza from Nasopharyngeal swab specimens and should not be used as a sole basis for treatment. Nasal washings and aspirates are unacceptable for Xpert Xpress SARS-CoV-2/FLU/RSV testing.  Fact Sheet for Patients: BloggerCourse.com  Fact Sheet for Healthcare Providers: SeriousBroker.it  This test is not yet approved or cleared by the [place] FDA and has been authorized for detection and/or diagnosis of SARS-CoV-2 by FDA under an Emergency Use Authorization (EUA). This EUA will remain in effect (meaning this test can be used) for the duration of the COVID-19 declaration under Section 564(b)(1) of the Act, 21 U.S.C. section 360bbb-3(b)(1), unless the authorization is terminated or revoked.  Performed at [facility], [address] [place], [place] [address]     Current Facility-Administered Medications  Medication Dose Route Frequency Provider Last Rate Last Admin   ibuprofen (ADVIL) tablet 600 mg  600 mg Oral Once Irean Hong, MD       Current Outpatient Medications   Medication Sig Dispense Refill   clonazePAM (KLONOPIN) 0.5 MG tablet Take 0.5 mg by mouth 2 (two) times daily. (Patient not taking: Reported on 02/01/2022)     clonazePAM (KLONOPIN) 1 MG tablet Take 1 mg by mouth 2 (two) times daily.     norethindrone-ethinyl estradiol-FE (JUNEL FE 1/20) 1-20 MG-MCG tablet Take 1 tablet by mouth daily. 84 tablet 4    Musculoskeletal: Strength & Muscle Tone: within normal limits Gait & Station: normal Patient leans: N/A  Psychiatric Specialty Exam:  Presentation  General Appearance: Appropriate for Environment  Eye Contact:Good  Speech:Clear and Coherent  Speech Volume:Normal  Handedness:No data recorded  Mood and Affect  Mood:Euthymic  Affect:Congruent   Thought Process  Thought Processes:Coherent  Descriptions of Associations:Intact  Orientation:Full (Time, Place and Person)  Thought Content:Logical  History of Schizophrenia/Schizoaffective disorder:No  Duration of Psychotic Symptoms:No data recorded Hallucinations:Hallucinations: None  Ideas of Reference:None  Suicidal Thoughts:Suicidal Thoughts: No  Homicidal Thoughts:Homicidal Thoughts: No   Sensorium  Memory:Immediate Good; Recent Fair  Judgment:Fair  Insight:Poor   Executive Functions  Concentration:Fair  Attention Span:Good  Recall:Fair  Fund of Knowledge:Good  Language:Fair   Psychomotor Activity  Psychomotor Activity:Psychomotor Activity: Normal   Assets  Assets:Financial Resources/Insurance; Housing; Resilience; Social Support; Physical Health   Sleep  Sleep:Sleep: Fair   Physical Exam: Physical Exam Vitals and nursing note reviewed.  HENT:     Head: Normocephalic.     Nose: No congestion or rhinorrhea.  Eyes:     General:  Right eye: No discharge.        Left eye: No discharge.  Cardiovascular:     Rate and Rhythm: Normal rate.  Pulmonary:     Effort: Pulmonary effort is normal.  Musculoskeletal:        General: Normal  range of motion.     Cervical back: Normal range of motion.  Skin:    General: Skin is dry.  Neurological:     Mental Status: She is alert and oriented to person, place, and time.  Psychiatric:        Attention and Perception: Attention normal.        Mood and Affect: Mood normal.        Speech: Speech normal.        Behavior: Behavior normal. Behavior is cooperative.        Thought Content: Thought content is not paranoid or delusional. Thought content does not include homicidal or suicidal ideation.        Cognition and Memory: Cognition normal.        Judgment: Judgment normal.    Review of Systems  Constitutional: Negative.   Eyes: Negative.   Respiratory: Negative.    Musculoskeletal: Negative.   Neurological: Negative.   Psychiatric/Behavioral:  Positive for substance abuse. Negative for depression, hallucinations, memory loss and suicidal ideas. The patient is not nervous/anxious (hx of) and does not have insomnia.    Blood pressure 116/79, pulse 79, temperature 98.4 F (36.9 C), temperature source Oral, resp. rate 18, height 5\' 5"  (1.651 m), weight 59 kg, SpO2 98 %. Body mass index is 21.63 kg/m.  Treatment Plan Summary: Plan Patient denies suicidal or homicidal ideation. She is clinically sober. Alert and oriented x 3. Patient does not meet criteria for inpatient psychiatric hospitalization.Recommend the website "psychologytoday.com" for mental health counseling. Patient staes she has insurance. If she does not have insurance, can contact [facility] for services for mental health. Reviewed with EDP.    Disposition: No evidence of imminent risk to self or others at present.   Supportive therapy provided about ongoing stressors. Discussed crisis plan, support from social network, calling 911, coming to the Emergency Department, and calling Suicide Hotline.  Vanetta MuldersLouise F Ross Bender, NP 02/01/2022 1:27 PM

## 2022-02-01 NOTE — ED Notes (Signed)
Discharge instructions reviewed with patient. Follow-up care reviewed. Patient  verbalized understanding. Patient A&Ox4, VSS, and ambulatory with steady gait upon discharge.  

## 2022-02-01 NOTE — ED Notes (Signed)
IVC papers rescinded by NP Gabriel Cirri

## 2022-02-01 NOTE — ED Notes (Signed)
Hospital meal provided, pt tolerated w/o complaints.  Waste discarded appropriately.  
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
## 2022-02-01 NOTE — Discharge Instructions (Signed)
The website "psychologytoday.com" is a good resource to find mental health resources. You may also check with your insurance company. If no insurance, you can call [facility] in [place], [phone]

## 2022-02-01 NOTE — ED Notes (Signed)
Pt given breakfast tray

## 2022-05-27 ENCOUNTER — Telehealth: Payer: Self-pay | Admitting: Family Medicine

## 2022-05-27 DIAGNOSIS — N921 Excessive and frequent menstruation with irregular cycle: Secondary | ICD-10-CM

## 2022-05-27 NOTE — Progress Notes (Signed)
[place]
